# Patient Record
Sex: Male | Born: 1940
Health system: Southern US, Community
[De-identification: ages and names within clinical notes are randomized; demographics above are authoritative.]

## PROBLEM LIST (undated history)

## (undated) DIAGNOSIS — C61 Malignant neoplasm of prostate: Secondary | ICD-10-CM

## (undated) DIAGNOSIS — Z9989 Dependence on other enabling machines and devices: Secondary | ICD-10-CM

## (undated) DIAGNOSIS — J40 Bronchitis, not specified as acute or chronic: Secondary | ICD-10-CM

## (undated) DIAGNOSIS — Z8701 Personal history of pneumonia (recurrent): Secondary | ICD-10-CM

## (undated) DIAGNOSIS — H9190 Unspecified hearing loss, unspecified ear: Secondary | ICD-10-CM

## (undated) DIAGNOSIS — IMO0001 Reserved for inherently not codable concepts without codable children: Secondary | ICD-10-CM

## (undated) DIAGNOSIS — K219 Gastro-esophageal reflux disease without esophagitis: Secondary | ICD-10-CM

## (undated) DIAGNOSIS — I10 Essential (primary) hypertension: Secondary | ICD-10-CM

## (undated) DIAGNOSIS — Z8489 Family history of other specified conditions: Secondary | ICD-10-CM

## (undated) DIAGNOSIS — F410 Panic disorder [episodic paroxysmal anxiety] without agoraphobia: Secondary | ICD-10-CM

## (undated) DIAGNOSIS — R350 Frequency of micturition: Secondary | ICD-10-CM

## (undated) DIAGNOSIS — R51 Headache: Secondary | ICD-10-CM

## (undated) DIAGNOSIS — J449 Chronic obstructive pulmonary disease, unspecified: Secondary | ICD-10-CM

## (undated) DIAGNOSIS — Z973 Presence of spectacles and contact lenses: Secondary | ICD-10-CM

## (undated) DIAGNOSIS — R0600 Dyspnea, unspecified: Secondary | ICD-10-CM

## (undated) DIAGNOSIS — M1990 Unspecified osteoarthritis, unspecified site: Secondary | ICD-10-CM

## (undated) HISTORY — PX: PROSTATE SURGERY: SHX751

## (undated) HISTORY — PX: BACK SURGERY: SHX140

## (undated) HISTORY — DX: Hemochromatosis, unspecified: E83.119

## (undated) HISTORY — DX: Essential (primary) hypertension: I10

## (undated) HISTORY — DX: Gastro-esophageal reflux disease without esophagitis: K21.9

---

## 1970-06-09 HISTORY — PX: KNEE SURGERY: SHX244

## 1990-06-09 HISTORY — PX: CARDIAC CATHETERIZATION: SHX172

## 1997-09-22 ENCOUNTER — Inpatient Hospital Stay (HOSPITAL_COMMUNITY): Admission: EM | Admit: 1997-09-22 | Discharge: 1997-09-25 | Payer: Self-pay | Admitting: Internal Medicine

## 2000-12-31 ENCOUNTER — Encounter: Payer: Self-pay | Admitting: Neurosurgery

## 2000-12-31 ENCOUNTER — Ambulatory Visit (HOSPITAL_COMMUNITY): Admission: RE | Admit: 2000-12-31 | Discharge: 2000-12-31 | Payer: Self-pay | Admitting: Neurosurgery

## 2001-02-03 ENCOUNTER — Ambulatory Visit (HOSPITAL_COMMUNITY): Admission: RE | Admit: 2001-02-03 | Discharge: 2001-02-04 | Payer: Self-pay | Admitting: Neurosurgery

## 2001-02-03 ENCOUNTER — Encounter: Payer: Self-pay | Admitting: Neurosurgery

## 2001-07-08 ENCOUNTER — Ambulatory Visit (HOSPITAL_COMMUNITY): Admission: RE | Admit: 2001-07-08 | Discharge: 2001-07-08 | Payer: Self-pay | Admitting: Neurosurgery

## 2001-07-08 ENCOUNTER — Encounter: Payer: Self-pay | Admitting: Neurosurgery

## 2001-07-09 ENCOUNTER — Encounter: Payer: Self-pay | Admitting: Neurosurgery

## 2001-07-09 ENCOUNTER — Ambulatory Visit (HOSPITAL_COMMUNITY): Admission: RE | Admit: 2001-07-09 | Discharge: 2001-07-10 | Payer: Self-pay | Admitting: Neurosurgery

## 2003-07-19 ENCOUNTER — Inpatient Hospital Stay (HOSPITAL_COMMUNITY): Admission: RE | Admit: 2003-07-19 | Discharge: 2003-07-24 | Payer: Self-pay | Admitting: Urology

## 2003-07-19 ENCOUNTER — Encounter (INDEPENDENT_AMBULATORY_CARE_PROVIDER_SITE_OTHER): Payer: Self-pay | Admitting: *Deleted

## 2004-03-05 ENCOUNTER — Ambulatory Visit (HOSPITAL_COMMUNITY): Admission: RE | Admit: 2004-03-05 | Discharge: 2004-03-05 | Payer: Self-pay | Admitting: Neurosurgery

## 2009-10-15 ENCOUNTER — Ambulatory Visit: Payer: Self-pay | Admitting: Cardiology

## 2009-10-23 ENCOUNTER — Ambulatory Visit (HOSPITAL_COMMUNITY): Admission: RE | Admit: 2009-10-23 | Discharge: 2009-10-24 | Payer: Self-pay | Admitting: Neurosurgery

## 2010-06-09 HISTORY — PX: COLONOSCOPY: SHX174

## 2010-08-27 LAB — DIFFERENTIAL
Basophils Absolute: 0 10*3/uL (ref 0.0–0.1)
Basophils Relative: 0 % (ref 0–1)
Eosinophils Absolute: 0.1 10*3/uL (ref 0.0–0.7)
Eosinophils Relative: 1 % (ref 0–5)
Lymphocytes Relative: 19 % (ref 12–46)
Lymphs Abs: 2.1 10*3/uL (ref 0.7–4.0)
Monocytes Absolute: 1.1 10*3/uL — ABNORMAL HIGH (ref 0.1–1.0)
Monocytes Relative: 10 % (ref 3–12)
Neutro Abs: 8 10*3/uL — ABNORMAL HIGH (ref 1.7–7.7)

## 2010-08-27 LAB — TYPE AND SCREEN: Antibody Screen: NEGATIVE

## 2010-08-27 LAB — CBC
Platelets: 299 10*3/uL (ref 150–400)
RBC: 4.67 MIL/uL (ref 4.22–5.81)
WBC: 11.3 10*3/uL — ABNORMAL HIGH (ref 4.0–10.5)

## 2010-08-27 LAB — SURGICAL PCR SCREEN
MRSA, PCR: NEGATIVE
Staphylococcus aureus: NEGATIVE

## 2010-09-18 DIAGNOSIS — R55 Syncope and collapse: Secondary | ICD-10-CM

## 2010-10-07 ENCOUNTER — Institutional Professional Consult (permissible substitution): Payer: Medicare Other | Admitting: Internal Medicine

## 2010-10-25 NOTE — Op Note (Signed)
NAME:  Ryan Richards, Ryan Richards NO.:  [id number]   MEDICAL RECORD NO.:  [id number]                   PATIENT TYPE:  INP   LOCATION:  X007                                 FACILITY:  [facility]   PHYSICIAN:  Rozanna Boer., M.D.      DATE OF BIRTH:  29-Nov-1940   DATE OF PROCEDURE:  07/19/2003  DATE OF DISCHARGE:                                 OPERATIVE REPORT   PREOPERATIVE DIAGNOSIS:  Adenocarcinoma of the prostate.   POSTOPERATIVE DIAGNOSIS:  Adenocarcinoma of the prostate.   PROCEDURE:  Radical retropubic prostatectomy with bilateral pelvic lymph  node dissection.   SURGEON:  Courtney Paris, M.D.   ASSISTANT:  Susanne Borders, MD   ANESTHESIA:  General endotracheal.   SPECIMENS:  1. Bilateral pelvic lymph nodes.  2. Prostate gland.   DRAINS:  1. 20-French Foley catheter to straight drain.  2. #10 Blake drain to bulb suction.   ESTIMATED BLOOD LOSS:  1500 cc.   DISPOSITION:  To recovery room in stable condition.   BRIEF INDICATIONS FOR SURGERY:  [name] is a 70 year old white male  [facility].  His PSA had risen to 4.3 from 3.0 one year prior. Subsequently underwent a  transrectal ultrasound-guided biopsy at the [facility] hospital and was found to  have Gleason's 3+3=6 adenocarcinoma of the prostate. He was subsequently  referred to Dr. Aldean Ast who discussed with him his various treatment  options including watchful waiting, hormonal therapy, radiation therapy, and  surgery. After careful discussion with full understanding of the risks,  benefits, and alternatives, the patient elected to undergo radical  retropubic prostatectomy.   DESCRIPTION OF PROCEDURE IN DETAIL:  The patient is brought to the operating  room and quickly identified by his identification bracelet. He was given  preoperative antibiotics and general endotracheal anesthesia. He was placed  in the supine  position with a bumper under his pelvis, with the table  slightly flexed, and in reverse Trendelenburg. He was shaved, prepped, and  draped in typical sterile fashion. A midline infraumbilical incision was  made from just below the umbilicus to the superior edge of the pubis. A  scalpel was used to incise the skin. Bovie electrocautery was used to incise  the Camper's and Scarpa fascia, down to the rectus sheath. The rectus sheath  was incised at the midline at the __________ fibers. This revealed diastasis  between the two bellies of the rectus abdominis.  The transversalis fascia  between the two muscle bellies were excised with Bovie electrocautery and  then blunt dissection was used with surgeon's hand to further define the  space of Retzius. A Foley catheter had been placed in the bladder which was  drained. The Buckwalter retractor was set and bilateral pelvic lymph node  dissection was performed. The extent of the lymph node was the external  iliac vein anteriorly. The pocket extended posteriorly to the obturator  nerve. Inferiorly, the circumflex iliac vein was the extent of dissection.  At this level, a large clip was placed on the right side of pocket and the  pocket was incised in this location. Further dissection was used to free the  posterior portion of the pocket up towards the pelvic inlet. A large right-  angle clip was placed to the proximal aspect of the pocket and was incised  with Metzenbaum scissors. A similar dissection was performed on the left  side. The pocket was somewhat thicker distally and the pocket was tied with  a 0-silk tie and incised. Blunt dissection was used to free the left side of  the pocket up superiorly where a large right angle Hemlock clip was placed  and the pocket was incised.  These were sent separately for pathologic  analysis. The bladder was then retracted superiorly. The patient had a thick  superficial branch of the dorsal venous complex  which was gathered with  right angle and doubly ligated with 2-0 silk and incised with Metzenbaum  scissors. A Kitner was used to sweep the areolar tissue away from the  endopelvic fascia bilaterally.  Metzenbaum scissors were used to make an  incision in the endopelvic fascia. Right angle dissection was used to free  this up and endopelvic fascia was released with Metzenbaum scissors  bilaterally from the puboprostatic ligaments distally to the superior margin  of the prostate superiorly. A Kitner was also used to clean off the  puboprostatic ligaments bilaterally. These were easily identified and were  gently taken down with Metzenbaum scissors dropping the apex of the prostate  away from the inferior aspect of the pubis bone. The prostate had dropped  away at this point such that the surgeon's index finger and thumb could be  placed around the dorsal venous complex and palpated inferior to the apex of  the prostate.  A __________  right angle was passed in this plane posterior  to the dorsal venous complex, anterior to the urethra. Two #1 Vicryl ties  were placed over the dorsal venous complex in this dislocation. A third 2-0  Vicryl suture with needle was passed and the back end was tied.  A figure-of-  eight suture ligature was then placed in the dorsal venous complex distally  to the previous ties. A Stamey retractor was used to retract the prostate  superiorly and a back-bleeding stitch using 2-0 Vicryl was placed in figure-  of-eight fashion.  A __________  right angle was replaced in the groove  beneath the dorsal venous complex and the dorsal venous complex was incised  with the Bovie electrocautery. There was minimal bleeding from the dorsal  venous complex at this point and what little bleeding there was was quickly  controlled passing a single finger-of-eight suture through the complex using 2-0 Vicryl. The Stamey retractor was replaced and the urethra was readily  identified.  The neurovascular bundles were identified running parallel to  the urethra bilaterally.  Metzenbaum scissors were used to free these from  the dorsal venous complex bilaterally.  The __________  right angle was then  passed beneath the urethra and a piece of umbilical tape was placed  posterior to the urethra. The anterior wall of the urethra was incised with  long-handle 15-blade knife. The valves of the Foley catheter were well  lubricated and cut with Mayo scissors, and was pulled into the pelvis and re-  clamped with a Tresa Endo.  The posterior urethra was incised with Metzenbaum  scissors. The patient had significant rectourethralis attachments which were  carefully dissected with right-angle dissection and incised with Metzenbaum  scissors. After the rectourethralis attachments were carefully taken down  the posterior plane between the anterior rectal fat and Denonvillier's  fascia could be established using blunt dissection with surgeon's finger.  The lateral pedicles were then systemically taken down with right-angle  dissection.  The bundles, as they were dissected, were tied with 2-0 silk  ties and incised with Metzenbaum scissors bilaterally.  Once the lateral  pedicles were taken down sufficiently, the outlying seminal vesicles beneath  Denonvillier's fascia could be appreciated. Denonvillier's fascia was  incised with the Bovie electrocautery and Metzenbaum scissors were used to  establish this plane.  The bilateral ampulla of the vas deferens were  dissected out with right angle. Large hemlock clips were placed on each vas  deferens and they were incised with Metzenbaum scissors. Seminal vesicles  were then dissected posteriorly.  They were controlled with 0 silk ties and  also incised with Metzenbaum scissors. Attention was then turned to the  bladder neck. An Allis clamp was placed above and below the bladder neck  and the plane was established with Bovie electrocautery. A  Vanderbilt was  used to carefully dissect out the urethra as it entered the bladder neck.  A  right angle was passed beneath the urethra as it entered the bladder neck  and the urethra was incised anteriorly. The Tresa Endo was removed from the cut  end of the Foley catheter and the balloon was drained. The balloon end was  pulled through the bladder neck, folded over on itself, and re-clamped with  a Tresa Endo. The remainder of the bladder neck dissection was performed with  Metzenbaum scissors to establish the plane between the base of the prostate  and the bladder. Bovie electrocautery was used to further dissect the base  of the prostate away from the bladder neck. There was some remaining lateral  pedicle attachments which were incised after placing hemlock clips on the  base of the pedicles. This freed the prostate and it was passed off the  table at this point.  Careful inspection of the pelvis revealed some venous bleeding near the bladder neck. This was controlled with Bovie  electrocautery for the most part. Some bleeding was noted in the lateral  pedicles bilaterally and this was controlled by placing 2-0 Vicryl in a  finger-of-eight fashion. The bladder mucosa was then carefully everted with  several interrupted 3-0 sutures. There was some bleeding noted at 6:00 in  the bladder neck and this was controlled with a 3-0 chromic placed in a  finger-of-eight fashion. There was minimal bleeding from the bladder mucosa  at this point.   Anastomotic sutures were then placed.  The 24-French green wall urethral  sound was placed in the urethra. Anastomotic sutures were placed at 2:00,  5:00, 7:00, 10:00, and 12:00.  The two posterior most sutures were placed in  their corresponding position at the bladder neck. The Foley catheter was  inserted to the urethra into the bladder. The balloon was inflated with  approximately 15 cc of sterile water. The remaining there anastomotic  sutures were placed  at their corresponding locations. The bladder was  released from traction and allowed to drop down into the pelvis. The  anastomotic sutures were carefully tied down.  Asepto syringe was used to  irrigated the Foley catheter and the  anastomosis appeared to be watertight.  There was very minimal  bleeding within the pelvis. The pelvis was copiously  irrigated. A #10 Blake drain was placed in the left lower quadrant making a  stab incision in the anterior abdominal wall and placing a Vanderbilt clamp  through the abdominal wall, grabbing the drain and pulling it through. It  was sewn in place with 2-0 nylon. The rectus sheath was then re-approximated  with running #1 PDS.  Subcutaneous tissues were copiously irrigated and the  skin was closed with a stapling device. All sponge and needle counts were  correct at the end of the case. The patient was awakened from his anesthesia  and taken to the postanesthesia care unit in stable condition. He is under  Dr. Aldean Ast, who was present, and participated in all aspects of this case  as he is the primary surgeon.     Susanne Borders, MD                           Rozanna Boer., M.D.    DR/MEDQ  D:  07/19/2003  T:  07/19/2003  Job:  [id number]

## 2010-10-25 NOTE — H&P (Signed)
NAME:  Ryan Richards, Ryan Richards NO.:  [id number]   MEDICAL RECORD NO.:  [id number]                   PATIENT TYPE:  INP   LOCATION:  X007                                 FACILITY:  [facility]   PHYSICIAN:  Rozanna Boer., M.D.      DATE OF BIRTH:  1940/08/06   DATE OF ADMISSION:  07/19/2003  DATE OF DISCHARGE:                                HISTORY & PHYSICAL   BRIEF HISTORY:  This 70 year old white male was admitted with a T1B Gleason  3+3 adenocarcinoma of the prostate with radical surgery.  A biopsy was done  at [facility] in December, 2004, because his PSA had elevated to 4.3  in August.  It had been 3.0 the year before.  He has not had UTI or  prostatitis.  He had cancer on both sides with 3/11 cores positive for  cancer.  Total length was 4 mm.  The total percentage of cancer in the  specimen from the biopsy was calculated at 2%.  After discussing the  options, he chose surgery over radiation.  He auto donated 2 units of blood  and did a mechanical bowel prep yesterday.  He understands that the risks  include but are not limited to bleeding, impotence, incontinence, deep  venous thrombosis, pulmonary emboli, bleeding, and death.   MEDICATIONS:  1. Nabumetone 75 mg daily.  2. Omeprazole 20 mg q.d.  3. Folic acid 800 mg q.d.  4. Vitamin E 200 IU daily.   ALLERGIES:  1. CODEINE.  2. MORPHINE.   PAST SURGICAL HISTORY:  1. Back spur and surgery on his back in 1987, 2000, and 2003.  2. Left knee operation in 1970.   REVIEW OF SYSTEMS:  He has been disabled from his back since February, 2003.  He has been a nonsmoker for the past nine years with occasional alcohol.  He  had a cardiac cath years ago that was negative and no GI complaints.  No  arthritis other than his back.   FAMILY HISTORY:  He has no children but is married.  He has seven sisters  with one brother.  Two brothers deceased.  One with an MI and one with  pneumonia.  Both  parents deceased in their late 54s.  He does have a  positive family history of cancer with his dad and his uncle having prostate  cancer.   PHYSICAL EXAMINATION:  VITAL SIGNS:  His weight is 210.  Pulse 61,  temperature 98.8, respirations 18, blood pressure 133/89.  GENERAL:  He is a healthy white male in no acute distress.  HEENT:  Clear.  NECK:  Supple.  LUNGS:  Clear to auscultation and percussion.  HEART:  No murmurs, rubs or gallops.  ABDOMEN:  Scars on his back from previous surgery, otherwise negative.  Spleen and liver not enlarged.  GENITOURINARY:  Penis is normal, circumcised, adequate meatus, bilaterally  descended testes.  Epididymis nontender.  Prostate 45-50 gm.  Not  fixed or  indurated.  EXTREMITIES:  No edema.  Good distal pulses.  Intact sensation to light  touch.   IMPRESSION:  1. T1B Gleason 3+3 adenocarcinoma of the prostate.  2. Chronic low back pain.  3. Family history of carcinoma of the prostate.  4. Mild obstructive uropathy.   Recommend radical retropubic prostatectomy, bilateral pelvis lymph node  dissection, as discussed.                                               Rozanna Boer., M.D.    HMK/MEDQ  D:  07/19/2003  T:  07/19/2003  Job:  [id number]

## 2010-10-25 NOTE — Op Note (Signed)
[place]. [facility]  Patient:    Ryan Richards, Ryan Richards Visit Number: [id number] MRN: [id number]          Service Type: DSU Location: 3000 3030 01 Attending Physician:  Ryan Richards Proc. Date: 02/03/01 Adm. Date:  02/03/2001                             Operative Report  PREOPERATIVE DIAGNOSIS:  Bilateral L4-5 herniated nucleus pulposus with stenosis and resultant bilateral L5 radiculopathy.  POSTOPERATIVE DIAGNOSIS:  Bilateral L4-5 herniated nucleus pulposus with stenosis and resultant bilateral L5 radiculopathy.   OPERATION PERFORMED:   Bilateral L4-5 decompressive laminotomies with bilateral L4-5 microdiskectomies.  SURGEON:  Ryan Richards, M.D.  ASSISTANT:  Donalee Richards, Ryan Richards.  ANESTHESIA:  General oral endotracheal.  INDICATIONS FOR PROCEDURE:  Ryan Richards is a 70 year old male with a history of chronic back pain with new onset of bilateral lower extremity pain, paresthesias and weaknesses with a combination of neurogenic claudication and chronic L5 radiculopathies on left greater than right.  The patient has failed conservative management.  MRI scanning demonstrates a bilobe disk herniation at L4-5 with resultant marked stenosis and bilateral compression of the L5 nerve roots.  The patient has been counseled as to his options.  He has decided to proceed with bilateral L4-5 decompressive laminotomies and microdiskectomies for hopeful improvement of symptoms.  DESCRIPTION OF PROCEDURE:  The patient was taken to the operating room and placed on the operating table in supine position.  After an adequate level of anesthesia was achieved, the patient was positioned prone onto a Wilson frame and appropriately padded.  The patients lumbar region was shaved and prepped sterilely.  A 10 blade was used to make a linear skin incision overlying the interspace.  This was carried down sharply in the midline.  Subperiosteal dissection was performed  bilaterally exposing the lamina and facet joints at L4 and L5.  The deep self-retaining retractor was placed.  Intraoperative x-ray was taken and the level was confirmed.  A decompressive laminotomy was then performed bilaterally using a high speed drill and Kerrison rongeurs to remove the inferior one half of the lamina of L4, the medial one third of the L4-5 facet joint and superior rim of the L5 lamina.  The ligamentum flavum was then elevated and resected in a piecemeal fashion using Kerrison rongeur.  The underlying thecal sac and exiting L5 nerve roots were identified bilaterally. Microscope was brought into the field and used for microdissection.  Starting first on the patients right side, the thecal sac and L5 nerve root were mobilized and retracted towards the midline.  The epidural venous plexus was coagulated and cut.  Disk herniation was readily apparent at the level of the disk space.  It was incised with a 15 blade in rectangular fashion.  A wide disk space clean-out was then achieved using pituitary rongeurs, upward angled pituitary rongeurs and Epstein curets.  All loose or obviously degenerative disk material was removed from the interspace.  The procedure was then repeated on the contralateral side.  Once again with the nerve roots protected and retracted, a large disk herniation was encountered just beneath the left-sided L5 nerve root.  This was incised with a 15 blade in a rectangular fashion.  A wide disk space clean-out was then achieved using pituitary rongeurs, upward angled, pituitary rongeurs and Epstein curets.  All loose or obviously degenerated disk material was removed from the interspace from  this approach.  At this point a very thorough diskectomy had been performed. A blunt probe was passed easily beneath the thecal sac and out each neural foramen.  There is no evidence of any continued compression.  The wound was then copiously irrigated with antibiotic  solution.  Gelfoam was placed topically for hemostasis which was found to be good.  Microscope and retractor system were removed.  Hemostasis in the muscle achieved with electrocautery. The wound was then closed in layers with Vicryl sutures.  Steri-Strips and sterile dressing were applied.  There were no apparent complications.  The patient tolerated the procedure well and she returned to the recovery room postoperatively. Attending Physician:  Ryan Richards DD:  02/03/01 TD:  02/03/01 Job: [id number]

## 2010-10-25 NOTE — Op Note (Signed)
[place]. [facility]  Patient:    Ryan Richards, Ryan Richards Visit Number: [id number] MRN: [id number]          Service Type: DSU Location: 3000 3025 01 Attending Physician:  Donn Pierini Dictated by:   Julio Sicks, M.D. Proc. Date: 07/09/01 Admit Date:  07/09/2001 Discharge Date: 07/10/2001                             Operative Report  PREOPERATIVE DIAGNOSIS:  Left L4-5 recurrent herniated nucleus pulposus with radiculopathy.  POSTOPERATIVE DIAGNOSIS:  Left L4-5 recurrent herniated nucleus pulposus with radiculopathy.  OPERATION:  Re-exploration of left L4-5 laminotomy with redo microdiskectomy.  SURGEON:  Julio Sicks, M.D.  ASSISTANT:  Donalee Citrin, Montez Hageman., M.D.  ANESTHESIA:  General endotracheal.  INDICATIONS:  Ryan Richards is a 70 year old male who has previously undergone an L5-S1 laminectomy and diskectomy remotely in the past.  He has recently undergone bilateral L4-5 laminotomies and microdiskectomies for treatment of an acute bilobe disk herniation.  The patient had been doing reasonably well the last 72 hours when he developed severe back pain with radiation into both lower extremities, somewhat greater on the right.  He had some numbness and paresthesias in his perineal region.  The MRI scanning demonstrated a very large left-sided L3-4 recurrent disk herniation with compression of the thecal sac.  Parenthetically, it should be noted that the patient has transitional anatomy at the bottom of his spine and the radiologist called this level L3-4 in his original dictation.  It has previously been referred to as L4-5 on other studies and at the time of his previous operations.  We discussed the options of management including the possibility of reexploring his laminotomy with redo microdiskectomy.  The patient has decided to proceed with surgery in hopes of alleviating some of his symptoms.  DESCRIPTION OF PROCEDURE:  The patient was taken to the  operating room and placed on the operating table in the supine position.  After an adequate level of anesthesia was achieved, the patient was positioned prone onto the Wilson frame and appropriately padded.  Preparation of the lumbar region, prepped and draped sterilely.  We made a skin incision overlying the L4-5 level.  This was carried down sharply in the midline.  A subperiosteal dissection was performed on the left side exposing the lamina and facet joints of L4-5.  Deep self-retaining retractors were placed.  X-rays were taken, and the level was confirmed.  A laminotomy at L4-5 was then reexplored using dental instruments to disrupt the fibrous scar tissue attachments around the laminotomy sites. The laminotomy was then widened using the high speed drill and 2 mm Kerrison rongeurs.  The underlying thecal sac was identified.  Epidural scar was stripped off the spinal sac and left side L4 nerve root repaired.  The microscope was brought into the field to use for microdissection and the left side L5 nerve root and underlying disk herniation.  Epidural venous plexus was coagulated and cut.  The thecal sac and L5 nerve root were mobilized and tracked towards the midline.  The disk herniation was readily apparent.  This was dissected free using a blunt nerve hook.  Disk fragments were then removed using pituitary rongeurs.  The disk space entered and aggressive diskectomy was performed.  All loose degenerative disk material was removed from the interspace.  All of the epidural scar was lysed.  All elements of the disk herniation were completely resected.  At this point, a very thorough decompression had been performed.  There was no evidence of injury to the thecal sac or nerve root.  The wound was then copiously irrigated with antibiotic solution.  Gelfoam was placed postoperatively for hemostasis which was found to be good.  The microscope and retractors were removed.  Hemostasis in the  muscle to achieve electrocautery.  The wound was then closed in layers with Vicryl sutures. Steri-Strips and a sterile dressing were applied.  The patient tolerated the procedure well and returned to the recovery room in good condition. Dictated by:   Julio Sicks, M.D. Attending Physician:  Donn Pierini DD:  07/09/01 TD:  07/11/01 Job: [id number]

## 2010-10-25 NOTE — Discharge Summary (Signed)
NAME:  Ryan Richards, Ryan Richards NO.:  [id number]   MEDICAL RECORD NO.:  [id number]                   PATIENT TYPE:  INP   LOCATION:  0380                                 FACILITY:  [facility]   PHYSICIAN:  Rozanna Boer., M.D.      DATE OF BIRTH:  [birth year]   DATE OF ADMISSION:  07/19/2003  DATE OF DISCHARGE:  07/24/2003                                 DISCHARGE SUMMARY   DISCHARGE DIAGNOSES:  1. T1b Gleason 3+3 adenocarcinoma of the prostate.  2. Chronic low back pain.  3. Mild obstructive uropathy.   OPERATIONS:  Radical retropubic prostatectomy, bilateral pelvic lymph node  dissection on July 19, 2003.   HOSPITAL COURSE:  This 70 year old white male is admitted with a T1b Gleason  3+3 adenocarcinoma of the prostate for radical surgery.  Biopsy done the  [facility] in December 2004, showed that the PSA was 4.3, in August  2004 was done.  It had been 3 the year before.  He has never had an UTI or  prostatitis.  He did have some mild voiding symptoms, but did have cancer on  both sides with 3:11 cords positive for cancer.  Total length was 4 mm, and  the total percentage of cancer in the specimen from the biopsy was  calculated at 2%.  After discussing the options, he chose surgery over  radiation.  He auto donated 2 units of blood and did mechanical bowel prep  the day before surgery.   ALLERGIES:  1. CODEINE.  2. MORPHINE.   PAST SURGICAL HISTORY:  1. Back surgery x3, in 1987, 2000, and 2003.  He has been disabled on his     back since February 2003.  2. Left knee operation in 1970.   After preoperative evaluation, including a normal chest x-ray, EKG, and lab  studies, he was taken to the operating room.  His preoperative hemoglobin  was 15.7, hematocrit 44%.  He underwent radical retropubic prostatectomy  with findings of no gross cancer outside the prostate.  He did have some  slow ooze during this occasion and had gotten his 2  units of autologous  blood.  His hematocrit remained fairly low at 28% and fell to 26, and then  on July 23, 2003, the fourth postoperative day it was 22.8.  His  recovery was delayed because of some nausea and vomiting on the third  postoperative day.  He was put back on clear liquids and then re-ambulated.  His hemoglobin stayed stable.  His path showed negative nodes, negative  margins, capsular extension was not identified, but the tumor was confined  by the prostatic capsule, however, it __________ up very focally to the  inked margin of the right lateral lobe near the apex.  The patient was on a  regular diet and was transfused with 2 more units of packed red blood cells  before he left for home, and was discharged on the fifth postoperative day  in satisfactory ambulatory  condition.  He was taking his preoperative medications, including Nabumetone  75 mg a day, Omeprazole 20 mg daily, Folic acid 800 mg daily, vitamin E 200  units per day, also taking Cipro 500 mg a day, Vicodin for pain, which he  tolerated very well.  He will return to the office in a couple of days just  for a suture removal.                                               Rozanna Boer., M.D.    HMK/MEDQ  D:  07/24/2003  T:  07/24/2003  Job:  [id number]

## 2012-07-25 ENCOUNTER — Emergency Department (HOSPITAL_COMMUNITY): Payer: Medicare Other

## 2012-07-25 ENCOUNTER — Other Ambulatory Visit: Payer: Self-pay

## 2012-07-25 ENCOUNTER — Encounter (HOSPITAL_COMMUNITY): Payer: Self-pay | Admitting: *Deleted

## 2012-07-25 ENCOUNTER — Observation Stay (HOSPITAL_COMMUNITY)
Admission: EM | Admit: 2012-07-25 | Discharge: 2012-07-26 | Disposition: A | Discharge status: Home / Self Care | Payer: Medicare Other | Attending: Internal Medicine | Admitting: Internal Medicine

## 2012-07-25 DIAGNOSIS — K219 Gastro-esophageal reflux disease without esophagitis: Secondary | ICD-10-CM | POA: Insufficient documentation

## 2012-07-25 DIAGNOSIS — I10 Essential (primary) hypertension: Secondary | ICD-10-CM | POA: Insufficient documentation

## 2012-07-25 DIAGNOSIS — R0602 Shortness of breath: Secondary | ICD-10-CM | POA: Insufficient documentation

## 2012-07-25 DIAGNOSIS — R0789 Other chest pain: Principal | ICD-10-CM | POA: Insufficient documentation

## 2012-07-25 DIAGNOSIS — R079 Chest pain, unspecified: Secondary | ICD-10-CM

## 2012-07-25 HISTORY — DX: Gastro-esophageal reflux disease without esophagitis: K21.9

## 2012-07-25 HISTORY — DX: Bronchitis, not specified as acute or chronic: J40

## 2012-07-25 HISTORY — DX: Malignant neoplasm of prostate: C61

## 2012-07-25 LAB — CBC WITH DIFFERENTIAL/PLATELET
Eosinophils Absolute: 0.1 10*3/uL (ref 0.0–0.7)
HCT: 45 % (ref 39.0–52.0)
Lymphocytes Relative: 36 % (ref 12–46)
Lymphs Abs: 2.1 10*3/uL (ref 0.7–4.0)
MCH: 33.5 pg (ref 26.0–34.0)
MCHC: 35.8 g/dL (ref 30.0–36.0)
Neutro Abs: 3 10*3/uL (ref 1.7–7.7)
RBC: 4.8 MIL/uL (ref 4.22–5.81)
RDW: 12.5 % (ref 11.5–15.5)
WBC: 5.8 10*3/uL (ref 4.0–10.5)

## 2012-07-25 LAB — BASIC METABOLIC PANEL
GFR calc Af Amer: 73 mL/min — ABNORMAL LOW (ref >90)
Potassium: 3.8 mEq/L (ref 3.5–5.1)
Sodium: 142 mEq/L (ref 135–145)

## 2012-07-25 LAB — TROPONIN I: Troponin I: <0.3 ng/mL (ref <0.30)

## 2012-07-25 MED ORDER — TRAMADOL HCL 50 MG PO TABS: 50.0000 mg | ORAL_TABLET | Freq: Four times a day (QID) | ORAL | Status: DC | PRN

## 2012-07-25 MED ORDER — NITROGLYCERIN 0.4 MG SL SUBL
0.4000 mg | SUBLINGUAL_TABLET | SUBLINGUAL | Status: DC | PRN
  Administered 2012-07-25: 0.4 mg via SUBLINGUAL
  Filled 2012-07-25: qty 25

## 2012-07-25 MED ORDER — ASPIRIN 325 MG PO TABS
325.0000 mg | ORAL_TABLET | Freq: Once | ORAL | Status: AC
  Administered 2012-07-25: 325 mg via ORAL
  Filled 2012-07-25: qty 1

## 2012-07-25 NOTE — ED Notes (Signed)
Patient states pain is increasing; requesting NTG at this time.  NTG x 1 given.

## 2012-07-25 NOTE — ED Provider Notes (Signed)
History  This chart was scribed for Ryan Hutching, MD by Erskine Emery, ED Scribe. This patient was seen in room APA19/APA19 and the patient's care was started at 21:16.   CSN: [id number]  Arrival date & time 07/25/12  2103   None     Chief Complaint  Patient presents with  . Chest Pain    (Consider location/radiation/quality/duration/timing/severity/associated sxs/prior treatment) The history is provided by the spouse and the patient. No language interpreter was used.  Ryan Richards is a 72 y.o. male who presents to the Emergency Department complaining of constant but gradually improving centralized pressure-like chest pain that does not radiate for the past 2 hours. Pt reports some associated SOB and feeling hot but he denies any diaphoresis, emesis, or nausea. Pt claims he was shoveling snow on a full stomach just before the onset of the pain. Pt took some nitroglycerine this evening, which he has only taken once before, and it helped relieve the pain. Pt had a catheterization in 1992 that was normal. He has no h/o heart failure but reports he has been "borderline" hypertensive for years. Pt's spouse reports noticing his elevated blood pressure the last couple days, especially this morning. Pt was taking aspirin but he stopped. He is still taking his medication for GERD. He has no h/o DM or smoking and he has no family h/o heart failure. Pt's spouse reports he has been eating mainly fruits and vegetables lately to lose weight.  History reviewed. No pertinent past medical history.  History reviewed. No pertinent past surgical history.  No family history on file.  History  Substance Use Topics  . Smoking status: Not on file  . Smokeless tobacco: Not on file  . Alcohol Use: Not on file      Review of Systems A complete 10 system review of systems was obtained and all systems are negative except as noted in the HPI and PMH.    Allergies  Review of patient's allergies indicates  not on file.  Home Medications  No current outpatient prescriptions on file.  There were no vitals taken for this visit.  Physical Exam  Nursing note and vitals reviewed. Constitutional: He is oriented to person, place, and time. He appears well-developed and well-nourished.  HENT:  Head: Normocephalic and atraumatic.  Eyes: Conjunctivae and EOM are normal. Pupils are equal, round, and reactive to light.  Neck: Normal range of motion. Neck supple.  Cardiovascular: Normal rate, regular rhythm and normal heart sounds.   Pulmonary/Chest: Effort normal and breath sounds normal.  Abdominal: Soft. Bowel sounds are normal.  Musculoskeletal: Normal range of motion.  Neurological: He is alert and oriented to person, place, and time.  Skin: Skin is warm and dry.  Psychiatric: He has a normal mood and affect.    ED Course  Procedures (including critical care time) DIAGNOSTIC STUDIES: Oxygen Saturation is 96% on room air, adequate by my interpretation.    COORDINATION OF CARE: 21:16--I evaluated the patient and we discussed a treatment plan including chest x-ray, EKG, and blood work to which the pt and his wife agreed.    Results for orders placed during the hospital encounter of 07/25/12  CBC WITH DIFFERENTIAL      Result Value Range   WBC 5.8  4.0 - 10.5 K/uL   RBC 4.80  4.22 - 5.81 MIL/uL   Hemoglobin 16.1  13.0 - 17.0 g/dL   HCT 44.0  10.2 - 72.5 %   MCV 93.8  78.0 - 100.0  fL   MCH 33.5  26.0 - 34.0 pg   MCHC 35.8  30.0 - 36.0 g/dL   RDW 82.9  56.2 - 13.0 %   Platelets 210  150 - 400 K/uL   Neutrophils Relative 52  43 - 77 %   Neutro Abs 3.0  1.7 - 7.7 K/uL   Lymphocytes Relative 36  12 - 46 %   Lymphs Abs 2.1  0.7 - 4.0 K/uL   Monocytes Relative 10  3 - 12 %   Monocytes Absolute 0.6  0.1 - 1.0 K/uL   Eosinophils Relative 2  0 - 5 %   Eosinophils Absolute 0.1  0.0 - 0.7 K/uL   Basophils Relative 0  0 - 1 %   Basophils Absolute 0.0  0.0 - 0.1 K/uL  BASIC METABOLIC PANEL       Result Value Range   Sodium 142  135 - 145 mEq/L   Potassium 3.8  3.5 - 5.1 mEq/L   Chloride 105  96 - 112 mEq/L   CO2 28  19 - 32 mEq/L   Glucose, Bld 100 (*) 70 - 99 mg/dL   BUN 15  6 - 23 mg/dL   Creatinine, Ser 8.65  0.50 - 1.35 mg/dL   Calcium 9.3  8.4 - 78.4 mg/dL   GFR calc non Af Amer 63 (*) >90 mL/min   GFR calc Af Amer 73 (*) >90 mL/min  TROPONIN I      Result Value Range   Troponin I <0.30  <0.30 ng/mL   Dg Chest Port 1 View  07/25/2012  *RADIOLOGY REPORT*  Clinical Data: Chest pain  PORTABLE CHEST - 1 VIEW  Comparison: Prior chest x-ray 05/26/2012  Findings: Stable appearance of the chest with chronic elevation of the left hemidiaphragm and mild cardiomegaly.  Mild pulmonary vascular congestion without overt edema.  Trace bibasilar atelectasis persists.  Negative for effusion, pneumothorax or new airspace consolidation.  No acute osseous abnormality.  IMPRESSION:  1.  No acute cardiopulmonary disease. 2.  Stable mild cardiomegaly and central vascular congestion without overt edema.   Original Report Authenticated By: Malachy Moan, M.D.       No diagnosis found.   Date: 07/25/2012  Rate: 78  Rhythm: normal sinus rhythm  QRS Axis: normal  Intervals: normal  ST/T Wave abnormalities: normal  Conduction Disutrbances: none  Narrative Interpretation: unremarkable     MDM  Pressure sensation chest after shoveling snow today.  Risk factors include hypercholesterolemia and hypertension along with age and male sex.  Good response to sublingual nitroglycerin. Admit.      I personally performed the services described in this documentation, which was scribed in my presence. The recorded information has been reviewed and is accurate.    Ryan Hutching, MD 07/25/12 [phone]

## 2012-07-25 NOTE — ED Notes (Signed)
Patient states has been shoveling snow today and c/o chest pain.  Wife states that patient sometimes vagals and feels faint.  A&O; skin w/d. Respirations even and unlabored; able to speak in complete sentences without difficulty.

## 2012-07-25 NOTE — ED Notes (Addendum)
Pt c/o constant centralized chest pressure x 2hrs. Pt does report some sob. Pt had episode of chest pain yesterday as well. Pt has been shoveling snow today.

## 2012-07-25 NOTE — ED Notes (Signed)
Patient refusing NTG at this time; states it gives him a bad headache.

## 2012-07-26 ENCOUNTER — Encounter (HOSPITAL_COMMUNITY): Payer: Self-pay

## 2012-07-26 DIAGNOSIS — I10 Essential (primary) hypertension: Secondary | ICD-10-CM

## 2012-07-26 DIAGNOSIS — K219 Gastro-esophageal reflux disease without esophagitis: Secondary | ICD-10-CM | POA: Diagnosis present

## 2012-07-26 LAB — TROPONIN I
Troponin I: <0.3 ng/mL (ref <0.30)
Troponin I: <0.3 ng/mL (ref <0.30)

## 2012-07-26 MED ORDER — PANTOPRAZOLE SODIUM 40 MG PO TBEC
40.0000 mg | DELAYED_RELEASE_TABLET | Freq: Every day | ORAL | Status: DC
  Administered 2012-07-26: 40 mg via ORAL
  Filled 2012-07-26: qty 1

## 2012-07-26 MED ORDER — METOPROLOL TARTRATE 25 MG PO TABS
12.5000 mg | ORAL_TABLET | Freq: Two times a day (BID) | ORAL | Status: DC
  Administered 2012-07-26 (×2): 12.5 mg via ORAL
  Filled 2012-07-26 (×2): qty 1

## 2012-07-26 MED ORDER — SENNOSIDES-DOCUSATE SODIUM 8.6-50 MG PO TABS: 1.0000 | ORAL_TABLET | Freq: Every evening | ORAL | Status: DC | PRN

## 2012-07-26 MED ORDER — NITROGLYCERIN 0.4 MG SL SUBL: 0.4000 mg | SUBLINGUAL_TABLET | SUBLINGUAL | Status: DC | PRN

## 2012-07-26 MED ORDER — ONDANSETRON HCL 4 MG PO TABS: 4.0000 mg | ORAL_TABLET | Freq: Four times a day (QID) | ORAL | Status: DC | PRN

## 2012-07-26 MED ORDER — ZOLPIDEM TARTRATE 5 MG PO TABS: 5.0000 mg | ORAL_TABLET | Freq: Every evening | ORAL | Status: DC | PRN

## 2012-07-26 MED ORDER — ACETAMINOPHEN 325 MG PO TABS
650.0000 mg | ORAL_TABLET | Freq: Four times a day (QID) | ORAL | Status: DC | PRN
  Administered 2012-07-26: 650 mg via ORAL
  Filled 2012-07-26: qty 2

## 2012-07-26 MED ORDER — ALUM & MAG HYDROXIDE-SIMETH 200-200-20 MG/5ML PO SUSP: 30.0000 mL | Freq: Four times a day (QID) | ORAL | Status: DC | PRN

## 2012-07-26 MED ORDER — SODIUM CHLORIDE 0.9 % IJ SOLN
3.0000 mL | Freq: Two times a day (BID) | INTRAMUSCULAR | Status: DC
  Filled 2012-07-26: qty 3

## 2012-07-26 MED ORDER — SODIUM CHLORIDE 0.9 % IJ SOLN
3.0000 mL | Freq: Two times a day (BID) | INTRAMUSCULAR | Status: DC
  Administered 2012-07-26 (×2): 3 mL via INTRAVENOUS

## 2012-07-26 MED ORDER — TRAMADOL HCL 50 MG PO TABS: 50.0000 mg | ORAL_TABLET | Freq: Four times a day (QID) | ORAL | Status: DC | PRN

## 2012-07-26 MED ORDER — SODIUM CHLORIDE 0.9 % IJ SOLN: 3.0000 mL | INTRAMUSCULAR | Status: DC | PRN

## 2012-07-26 MED ORDER — ACETAMINOPHEN 650 MG RE SUPP: 650.0000 mg | Freq: Four times a day (QID) | RECTAL | Status: DC | PRN

## 2012-07-26 MED ORDER — SODIUM CHLORIDE 0.9 % IV SOLN: 250.0000 mL | INTRAVENOUS | Status: DC | PRN

## 2012-07-26 MED ORDER — ONDANSETRON HCL 4 MG/2ML IJ SOLN: 4.0000 mg | Freq: Four times a day (QID) | INTRAMUSCULAR | Status: DC | PRN

## 2012-07-26 NOTE — Discharge Summary (Signed)
Physician Discharge Summary  Ryan Richards:[id number] DOB: [birth year] DOA: 07/25/2012  PCP: No primary provider on file.  Admit date: 07/25/2012 Discharge date: 07/26/2012  Recommendations for Outpatient Follow-up:  1. Pt will need to follow up with PCP in 2-3 weeks post discharge 2. Please obtain BMP to evaluate electrolytes and kidney function 3. Please also check CBC to evaluate Hg and Hct levels 4. I have scheduled an appointment with cardiologist for further evaluation and recommendations   Discharge Diagnoses: Chest pain Principal Problem:   Chest pain Active Problems:   HTN (hypertension)   GERD (gastroesophageal reflux disease)  Discharge Condition: Stable  Diet recommendation: Heart healthy diet discussed in details   History of present illness:  Pt is 72 y.o. male with HTN and GERD who presented to the [facility] complaining of chest pain. The chest pain started one day prior to admission after shoveling snow, acute onset chest tightness, with pressure and pain, central chest, no radiation, mild SOB, no diaphoresis, no NV. He does say that he started working after eating a large meal. He reports a history of esophageal spasm in 1992 where he actually had a cardiac cath done for persistent 10/10 chest pain-at that time his coronaries were completely normal. He gets his medical care at the [place]. His lipids have been slightly elevated, he is statin intolerant. He takes ASA, reports his BP has been more elevated than usual, he monitors this at home. Pain resolved in ED after 1 sublingual dose of NTG, his CE's are negative and his EKG is normal.  Hospital Course: Chest pain Principal Problem:   Chest pain - unclear etiology and possibly musculoskeletal - stress test may be of benefit but not necessary inpatient - CE x 2 sets negative, TSH within normal limits - will schedule an appointment in an outpatient setting with cardiologist  Active Problems:   HTN (hypertension) -  reasonable control inpatient   GERD (gastroesophageal reflux disease) - stable while in hospital   Procedures/Studies: Dg Chest Port 1 View 07/25/2012   1.  No acute cardiopulmonary disease.  2.  Stable mild cardiomegaly and central vascular congestion without overt edema.    Consultations:  None  Antibiotics:  None  Discharge Exam: Filed Vitals:   07/26/12 1440  BP: 145/85  Pulse: 52  Temp: 97.7 F (36.5 C)  Resp:    Filed Vitals:   07/25/12 2332 07/26/12 0021 07/26/12 0639 07/26/12 1440  BP: 128/76 150/89 138/77 145/85  Pulse: 62 67 52 52  Temp: 98.2 F (36.8 C) 97.6 F (36.4 C) 98.1 F (36.7 C) 97.7 F (36.5 C)  TempSrc: Oral Oral Oral   Resp: 16     Height:  6\' 1"  (1.854 m)    Weight:  220 lb (99.791 kg)    SpO2: 97% 97% 97% 97%    General: Pt is alert, follows commands appropriately, not in acute distress Cardiovascular: Regular rate and rhythm, S1/S2 +, no murmurs, no rubs, no gallops Respiratory: Clear to auscultation bilaterally, no wheezing, no crackles, no rhonchi Abdominal: Soft, non tender, non distended, bowel sounds +, no guarding Extremities: no edema, no cyanosis, pulses palpable bilaterally DP and PT Neuro: Grossly nonfocal  Discharge Instructions  Discharge Orders   Future Appointments Provider Department Dept Phone   07/29/2012 1:15 PM Wendall Stade, MD [facility] [place] [phone]   Future Orders Complete By Expires     Diet - low sodium heart healthy  As directed     Increase activity  slowly  As directed         Medication List    TAKE these medications       Fish Oil 1200 MG Caps  Take 1,200 mg by mouth 2 (two) times daily.     multivitamin tablet  Take 1 tablet by mouth daily.     nitroGLYCERIN 0.4 MG SL tablet  Commonly known as:  NITROSTAT  Place 1 tablet (0.4 mg total) under the tongue every 5 (five) minutes as needed for chest pain (CP or SOB).     omeprazole 20 MG capsule  Commonly known as:   PRILOSEC  Take 20 mg by mouth daily.     traMADol 50 MG tablet  Commonly known as:  ULTRAM  Take 1 tablet (50 mg total) by mouth every 6 (six) hours as needed.           Follow-up Information   Follow up with Charlton Haws, MD On 07/29/2012. (appointment scheduled for 1:15 pm, please atrrive 15 minutes earlier)    Contact information:   [address] [place] [address] [phone]        The results of significant diagnostics from this hospitalization (including imaging, microbiology, ancillary and laboratory) are listed below for reference.     Microbiology: No results found for this or any previous visit (from the past 240 hour(s)).   Labs: Basic Metabolic Panel:  Recent Labs Lab 07/25/12 2120  NA 142  K 3.8  CL 105  CO2 28  GLUCOSE 100*  BUN 15  CREATININE 1.14  CALCIUM 9.3   CBC:  Recent Labs Lab 07/25/12 2120  WBC 5.8  NEUTROABS 3.0  HGB 16.1  HCT 45.0  MCV 93.8  PLT 210   Cardiac Enzymes:  Recent Labs Lab 07/25/12 2120 07/26/12 0234 07/26/12 0844  TROPONINI <0.30 <0.30 <0.30   SIGNED: Time coordinating discharge: Over 30 minutes  Debbora Presto, MD  [facility] 07/26/2012, 2:40 PM Pager [phone]  If 7PM-7AM, please contact night-coverage [email or web address] Password [email or web address]

## 2012-07-26 NOTE — Progress Notes (Signed)
UR Chart Review Completed  

## 2012-07-26 NOTE — H&P (Signed)
[facility] History and Physical  TAMI BARREN WUJ:[id number] DOB: May 15, 1941 DOA: 07/25/2012  Referring physician: Particia Lather PCP: No primary provider on file.  Specialists: [facility]  Chief Complaint: Chest Pain  HPI: Ryan Richards is a 72 y.o. male with HTN and GERD who presented to the [facility] complaining of chest pain. The chest pain started yesterday after shoveling snow, acute onset chest tightness, with pressure and pain, central chest no radiation, mild SOB, no diaphoresis, no NV. He does say that he started working after eating a large meal. He reports a history of esophageal spasm in 1992 where he actually had a cardiac cath done for persistent 10/10 chest pain-at that time his coronaries were completely normal. He get his medical care at the [place]. His lipids have been slightly elevated, he is statin intolerant. He takes ASA, reports his BP has been more elevated than usual, he monitors this at home. Pain resolved in ED after 1 sublingual dose of NTG, his CE's are negative and his EKG is normal.   Review of Systems: The patient denies anorexia, fever, weight loss,, vision loss, decreased hearing, hoarseness, syncope, dyspnea on exertion, peripheral edema, balance deficits, hemoptysis, abdominal pain, melena, hematochezia, severe indigestion/heartburn, hematuria, incontinence, genital sores, muscle weakness, suspicious skin lesions, transient blindness, difficulty walking, depression, unusual weight change, abnormal bleeding, enlarged lymph nodes, angioedema, and breast masses.    Past Medical History  Diagnosis Date  . Prostate cancer   . Bronchitis   . Acid reflux    Past Surgical History  Procedure Laterality Date  . Back surgery    . Prostate surgery    . Knee surgery     Social History:  reports that he has never smoked. He does not have any smokeless tobacco history on file. He reports that  drinks alcohol. He reports that he does not use illicit drugs. Lives  at home with his wife, active and without disability.  Allergies  Allergen Reactions  . Codeine   . Morphine And Related     History reviewed. No pertinent family history. No history of CAD or MI.  Prior to Admission medications   Medication Sig Start Date End Date Taking? Authorizing Provider  Multiple Vitamin (MULTIVITAMIN) tablet Take 1 tablet by mouth daily.   Yes Historical Provider, MD  Omega-3 Fatty Acids (FISH OIL) 1200 MG CAPS Take 1,200 mg by mouth 2 (two) times daily.   Yes Historical Provider, MD  omeprazole (PRILOSEC) 20 MG capsule Take 20 mg by mouth daily.   Yes Historical Provider, MD   Physical Exam: Filed Vitals:   07/25/12 2216 07/25/12 2332 07/25/12 2332 07/26/12 0021  BP: 127/75 131/78 128/76 150/89  Pulse: 60 64 62 67  Temp: 98.1 F (36.7 C)  98.2 F (36.8 C) 97.6 F (36.4 C)  TempSrc: Oral  Oral Oral  Resp: 18 18 16    Height:    6\' 1"  (1.854 m)  Weight:    99.791 kg (220 lb)  SpO2: 93% 96% 97% 97%     General:  Healthy appearing  Eyes: normal  ENT: normal  Neck: normal  Cardiovascular: RRR, no mrg  Respiratory: CTAB  Abdomen: soft NT  Skin: normal, no lesions or rashes  Musculoskeletal: normal  Psychiatric: appropriate  Neurologic: non-focal  Labs on Admission:  Basic Metabolic Panel:  Recent Labs Lab 07/25/12 2120  NA 142  K 3.8  CL 105  CO2 28  GLUCOSE 100*  BUN 15  CREATININE 1.14  CALCIUM 9.3  Liver Function Tests: No results found for this basename: AST, ALT, ALKPHOS, BILITOT, PROT, ALBUMIN,  in the last 168 hours No results found for this basename: LIPASE, AMYLASE,  in the last 168 hours No results found for this basename: AMMONIA,  in the last 168 hours CBC:  Recent Labs Lab 07/25/12 2120  WBC 5.8  NEUTROABS 3.0  HGB 16.1  HCT 45.0  MCV 93.8  PLT 210   Cardiac Enzymes:  Recent Labs Lab 07/25/12 2120 07/26/12 0234  TROPONINI <0.30 <0.30   Radiological Exams on Admission: Dg Chest Port 1  View  07/25/2012  *RADIOLOGY REPORT*  Clinical Data: Chest pain  PORTABLE CHEST - 1 VIEW  Comparison: Prior chest x-ray 05/26/2012  Findings: Stable appearance of the chest with chronic elevation of the left hemidiaphragm and mild cardiomegaly.  Mild pulmonary vascular congestion without overt edema.  Trace bibasilar atelectasis persists.  Negative for effusion, pneumothorax or new airspace consolidation.  No acute osseous abnormality.  IMPRESSION:  1.  No acute cardiopulmonary disease. 2.  Stable mild cardiomegaly and central vascular congestion without overt edema.   Original Report Authenticated By: Malachy Moan, M.D.     EKG: Independently reviewed. NSR  Assessment/Plan 72 yo with HTN, hyperlipidemia, and GERD admitted with atypical chest pain, possibly exertional but he had also eaten a large meal, has a history of esophageal spasm and moderately severe GERD requiring daily PPI, Nonetheless, he will be admitted for ACS rule out. So far CE are negative and EKG is normal, his chest pain has completely resolved. The only abnormality is some mild vascular congestion noted on his CXR, but no overt edema.   Admitted as Observation  Tele, Cycle CE, repeat 12 Lead  Fasting Lipids in AM  Given ASA, BB, O2 and PPI  Code Status: Full Code Family Communication: Discussed plan with patient Disposition Plan: Home with outpatient stress test, if CE negative increase PPI to BID, may need GI referral for EGD. Low dose diltiazem may help with esophageal spam if he need additional BP control. Time spent: 50 minutes  [facility] Pager [phone]  If 7PM-7AM, please contact night-coverage [email or web address] Password [facility] 07/26/2012, 4:10 AM

## 2012-07-27 NOTE — Progress Notes (Signed)
Discharge instructions and prescriptions given, verbalized understanding, out in stable condition with staff. 

## 2012-07-29 ENCOUNTER — Encounter: Payer: Medicare Other | Admitting: Cardiovascular Disease

## 2013-06-09 DIAGNOSIS — Z8701 Personal history of pneumonia (recurrent): Secondary | ICD-10-CM

## 2013-06-09 HISTORY — DX: Personal history of pneumonia (recurrent): Z87.01

## 2014-06-21 ENCOUNTER — Other Ambulatory Visit: Payer: Self-pay | Admitting: Neurosurgery

## 2014-06-21 DIAGNOSIS — M5416 Radiculopathy, lumbar region: Secondary | ICD-10-CM

## 2014-07-04 ENCOUNTER — Ambulatory Visit
Admission: RE | Admit: 2014-07-04 | Discharge: 2014-07-04 | Disposition: A | Discharge status: Home / Self Care | Payer: Medicare Other | Source: Ambulatory Visit | Attending: Neurosurgery | Admitting: Neurosurgery

## 2014-07-04 DIAGNOSIS — M5416 Radiculopathy, lumbar region: Secondary | ICD-10-CM

## 2014-07-04 MED ORDER — GADOBENATE DIMEGLUMINE 529 MG/ML IV SOLN
20.0000 mL | Freq: Once | INTRAVENOUS | Status: AC | PRN
  Administered 2014-07-04: 20 mL via INTRAVENOUS

## 2014-10-31 ENCOUNTER — Other Ambulatory Visit: Payer: Self-pay | Admitting: Neurosurgery

## 2014-10-31 DIAGNOSIS — M5416 Radiculopathy, lumbar region: Secondary | ICD-10-CM

## 2015-01-05 ENCOUNTER — Other Ambulatory Visit: Payer: Self-pay | Admitting: Orthopaedic Surgery

## 2015-01-05 DIAGNOSIS — M542 Cervicalgia: Secondary | ICD-10-CM

## 2015-01-09 ENCOUNTER — Ambulatory Visit
Admission: RE | Admit: 2015-01-09 | Discharge: 2015-01-09 | Disposition: A | Discharge status: Home / Self Care | Payer: Medicare Other | Source: Ambulatory Visit | Attending: Orthopaedic Surgery | Admitting: Orthopaedic Surgery

## 2015-01-09 DIAGNOSIS — M542 Cervicalgia: Secondary | ICD-10-CM

## 2015-01-26 ENCOUNTER — Other Ambulatory Visit: Payer: Self-pay | Admitting: Neurosurgery

## 2015-03-09 ENCOUNTER — Encounter (HOSPITAL_COMMUNITY): Payer: Self-pay

## 2015-03-09 ENCOUNTER — Encounter (HOSPITAL_COMMUNITY)
Admission: RE | Admit: 2015-03-09 | Discharge: 2015-03-09 | Disposition: A | Discharge status: Home / Self Care | Payer: Medicare Other | Source: Ambulatory Visit | Attending: Neurosurgery | Admitting: Neurosurgery

## 2015-03-09 DIAGNOSIS — Z01818 Encounter for other preprocedural examination: Secondary | ICD-10-CM | POA: Diagnosis present

## 2015-03-09 DIAGNOSIS — M4802 Spinal stenosis, cervical region: Secondary | ICD-10-CM | POA: Insufficient documentation

## 2015-03-09 HISTORY — DX: Essential (primary) hypertension: I10

## 2015-03-09 HISTORY — DX: Personal history of pneumonia (recurrent): Z87.01

## 2015-03-09 HISTORY — DX: Family history of other specified conditions: Z84.89

## 2015-03-09 HISTORY — DX: Headache: R51

## 2015-03-09 HISTORY — DX: Unspecified osteoarthritis, unspecified site: M19.90

## 2015-03-09 HISTORY — DX: Chronic obstructive pulmonary disease, unspecified: J44.9

## 2015-03-09 HISTORY — DX: Reserved for inherently not codable concepts without codable children: IMO0001

## 2015-03-09 HISTORY — DX: Frequency of micturition: R35.0

## 2015-03-09 LAB — BASIC METABOLIC PANEL
Anion gap: 6 (ref 5–15)
BUN: 9 mg/dL (ref 6–20)
CO2: 25 mmol/L (ref 22–32)
CREATININE: 1.03 mg/dL (ref 0.61–1.24)
Calcium: 9.4 mg/dL (ref 8.9–10.3)
Chloride: 107 mmol/L (ref 101–111)
GFR calc Af Amer: >60 mL/min (ref >60)
GFR calc non Af Amer: >60 mL/min (ref >60)
GLUCOSE: 99 mg/dL (ref 65–99)
POTASSIUM: 4 mmol/L (ref 3.5–5.1)
Sodium: 138 mmol/L (ref 135–145)

## 2015-03-09 LAB — CBC
HEMATOCRIT: 45 % (ref 39.0–52.0)
HEMOGLOBIN: 15.2 g/dL (ref 13.0–17.0)
MCH: 32.1 pg (ref 26.0–34.0)
MCHC: 33.8 g/dL (ref 30.0–36.0)
MCV: 94.9 fL (ref 78.0–100.0)
Platelets: 189 10*3/uL (ref 150–400)
RBC: 4.74 MIL/uL (ref 4.22–5.81)
RDW: 12.8 % (ref 11.5–15.5)
WBC: 4.7 10*3/uL (ref 4.0–10.5)

## 2015-03-09 LAB — SURGICAL PCR SCREEN
MRSA, PCR: NEGATIVE
Staphylococcus aureus: NEGATIVE

## 2015-03-09 NOTE — Progress Notes (Signed)
   03/09/15 1028  OBSTRUCTIVE SLEEP APNEA  Have you ever been diagnosed with sleep apnea through a sleep study? No  Do you snore loudly (loud enough to be heard through closed doors)?  0  Do you often feel tired, fatigued, or sleepy during the daytime (such as falling asleep during driving or talking to someone)? 1  Has anyone observed you stop breathing during your sleep? 0  Do you have, or are you being treated for high blood pressure? 1  BMI more than 35 kg/m2? 0  Age > 50 (1-yes) 1  Neck circumference greater than:Male 16 inches or larger, Male 17inches or larger? 1  Male Gender (Yes=1) 1  Obstructive Sleep Apnea Score 5

## 2015-03-09 NOTE — Pre-Procedure Instructions (Signed)
    Ryan Richards  03/09/2015     No Pharmacies Listed   Your procedure is scheduled on Tuesday, October 11th, 2016.  Report to [facility] Admitting at 5:30 A.M.  Call this number if you have problems the morning of surgery:  [phone]   Remember:  Do not eat food or drink liquids after midnight.   Take these medicines the morning of surgery with A SIP OF WATER: Albuterol Inhaler if needed (please bring with you), Omeprazole (Prilosec).  Stop taking the following medications on Tuesday, October 4th: Omega-3 Fatty Acids (fish oil), Aspirin, NSAIDS, Ibuprofen, Aleve, Naproxen, BC's, Motrin, all herbal medications, and all vitamins.    Do not wear jewelry.  Do not wear lotions, powders, or colognes.  You may NOT wear deodorant.  Men may shave face and neck.  Do not bring valuables to the hospital.  [facility] is not responsible for any belongings or valuables.  Contacts, dentures or bridgework may not be worn into surgery.  Leave your suitcase in the car.  After surgery it may be brought to your room.  For patients admitted to the hospital, discharge time will be determined by your treatment team.  Patients discharged the day of surgery will not be allowed to drive home.   Special instructions:  See attached.   Please read over the following fact sheets that you were given. Pain Booklet, Coughing and Deep Breathing, MRSA Information and Surgical Site Infection Prevention

## 2015-03-09 NOTE — Progress Notes (Signed)
PCP - Florian Buff, PA Pt. States that he also goes to the [facility] [place] in [place].  Cardiologist - denies  EKG - 03/09/15 - Epic CXR- requested from pulmonologist   Echo- 2014 - Epic Stress test- requested Cardiac cath- 1992  Patient denies shortness of breath and chest pain at PAT appointment.

## 2015-03-12 NOTE — Progress Notes (Addendum)
Anesthesia Chart Review:  Pt is 74 year old male scheduled for C4-5, C5-6, C6-7 ACDF on 03/20/2015 with Dr. Annette Stable.   PMH includes: HTN, COPD, prostate cancer. Never smoker. BMI 30.   Medications include: albuterol, prilosec.   Preoperative labs reviewed.    EKG 03/09/2015: NSR.   Nuclear stress test 10/19/2009 [facility]): -The nuclear images reveal no significant abnormalities -Wall motion is normal -There is no scar or ischemia -this is a normal stress nuclear scan.   If no changes, I anticipate pt can proceed with surgery as scheduled.   Willeen Cass, FNP-BC [facility]/Anesthesiology Phone: [phone] 03/12/2015 1:23 PM  Addendum:  Records received from pt's pulmonologist, Dr. Rogelia Mire at the [facility]. Last office visit 08/16/14 for COPD exacerbation. At that time pt was recovering well, sx improved. F/u recommended in 1 year.   Chest CT 07/19/2014: Mild bibasilar atelectasis.   If no changes, I anticipate pt can proceed with surgery as scheduled.   Willeen Cass, FNP-BC [facility]/Anesthesiology Phone: [phone] 03/15/2015 12:08 PM

## 2015-03-20 ENCOUNTER — Inpatient Hospital Stay (HOSPITAL_COMMUNITY): Payer: Medicare Other

## 2015-03-20 ENCOUNTER — Inpatient Hospital Stay (HOSPITAL_COMMUNITY): Payer: Medicare Other | Admitting: Certified Registered"

## 2015-03-20 ENCOUNTER — Inpatient Hospital Stay (HOSPITAL_COMMUNITY): Payer: Medicare Other | Admitting: Emergency Medicine

## 2015-03-20 ENCOUNTER — Encounter (HOSPITAL_COMMUNITY)
Admission: RE | Disposition: A | Discharge status: Home / Self Care | Payer: Self-pay | Source: Ambulatory Visit | Attending: Neurosurgery

## 2015-03-20 ENCOUNTER — Inpatient Hospital Stay (HOSPITAL_COMMUNITY)
Admission: RE | Admit: 2015-03-20 | Discharge: 2015-03-21 | DRG: 472 | Disposition: A | Discharge status: Home / Self Care | Payer: Medicare Other | Source: Ambulatory Visit | Attending: Neurosurgery | Admitting: Neurosurgery

## 2015-03-20 ENCOUNTER — Encounter (HOSPITAL_COMMUNITY): Payer: Self-pay | Admitting: Certified Registered"

## 2015-03-20 DIAGNOSIS — K219 Gastro-esophageal reflux disease without esophagitis: Secondary | ICD-10-CM | POA: Diagnosis present

## 2015-03-20 DIAGNOSIS — Z8546 Personal history of malignant neoplasm of prostate: Secondary | ICD-10-CM

## 2015-03-20 DIAGNOSIS — Z79899 Other long term (current) drug therapy: Secondary | ICD-10-CM

## 2015-03-20 DIAGNOSIS — J449 Chronic obstructive pulmonary disease, unspecified: Secondary | ICD-10-CM | POA: Diagnosis present

## 2015-03-20 DIAGNOSIS — G9520 Unspecified cord compression: Secondary | ICD-10-CM | POA: Diagnosis present

## 2015-03-20 DIAGNOSIS — I10 Essential (primary) hypertension: Secondary | ICD-10-CM | POA: Diagnosis present

## 2015-03-20 DIAGNOSIS — M542 Cervicalgia: Secondary | ICD-10-CM

## 2015-03-20 DIAGNOSIS — Z885 Allergy status to narcotic agent status: Secondary | ICD-10-CM | POA: Diagnosis not present

## 2015-03-20 DIAGNOSIS — M4802 Spinal stenosis, cervical region: Secondary | ICD-10-CM | POA: Diagnosis present

## 2015-03-20 DIAGNOSIS — R55 Syncope and collapse: Secondary | ICD-10-CM | POA: Diagnosis not present

## 2015-03-20 DIAGNOSIS — M4722 Other spondylosis with radiculopathy, cervical region: Secondary | ICD-10-CM | POA: Diagnosis present

## 2015-03-20 HISTORY — PX: ANTERIOR CERVICAL DECOMP/DISCECTOMY FUSION: SHX1161

## 2015-03-20 SURGERY — ANTERIOR CERVICAL DECOMPRESSION/DISCECTOMY FUSION 3 LEVELS
Anesthesia: General | Site: Neck

## 2015-03-20 MED ORDER — GLYCOPYRROLATE 0.2 MG/ML IJ SOLN
INTRAMUSCULAR | Status: DC | PRN
  Administered 2015-03-20: 0.2 mg via INTRAVENOUS
  Administered 2015-03-20: 0.4 mg via INTRAVENOUS

## 2015-03-20 MED ORDER — CEFAZOLIN SODIUM-DEXTROSE 2-3 GM-% IV SOLR
2.0000 g | INTRAVENOUS | Status: AC
  Administered 2015-03-20: 2 g via INTRAVENOUS
  Filled 2015-03-20: qty 50

## 2015-03-20 MED ORDER — FENTANYL CITRATE (PF) 250 MCG/5ML IJ SOLN
INTRAMUSCULAR | Status: AC
  Filled 2015-03-20: qty 5

## 2015-03-20 MED ORDER — OMEGA-3-ACID ETHYL ESTERS 1 G PO CAPS
1.0000 | ORAL_CAPSULE | Freq: Two times a day (BID) | ORAL | Status: DC
  Filled 2015-03-20 (×4): qty 1

## 2015-03-20 MED ORDER — CYCLOBENZAPRINE HCL 10 MG PO TABS
10.0000 mg | ORAL_TABLET | Freq: Three times a day (TID) | ORAL | Status: DC | PRN
  Administered 2015-03-20 – 2015-03-21 (×2): 10 mg via ORAL
  Filled 2015-03-20 (×2): qty 1

## 2015-03-20 MED ORDER — LIDOCAINE HCL (CARDIAC) 20 MG/ML IV SOLN
INTRAVENOUS | Status: DC | PRN
  Administered 2015-03-20: 60 mg via INTRAVENOUS

## 2015-03-20 MED ORDER — PHENYLEPHRINE HCL 10 MG/ML IJ SOLN
INTRAMUSCULAR | Status: DC | PRN
  Administered 2015-03-20: 80 ug via INTRAVENOUS
  Administered 2015-03-20: 40 ug via INTRAVENOUS

## 2015-03-20 MED ORDER — DEXAMETHASONE SODIUM PHOSPHATE 4 MG/ML IJ SOLN
INTRAMUSCULAR | Status: DC | PRN
  Administered 2015-03-20: 8 mg via INTRAVENOUS

## 2015-03-20 MED ORDER — GLUCOSAMINE-CHONDROITIN 500-400 MG PO TABS: 1.0000 | ORAL_TABLET | Freq: Two times a day (BID) | ORAL | Status: DC

## 2015-03-20 MED ORDER — SODIUM CHLORIDE 0.9 % IJ SOLN
INTRAMUSCULAR | Status: AC
  Filled 2015-03-20: qty 10

## 2015-03-20 MED ORDER — SODIUM CHLORIDE 0.9 % IJ SOLN: 3.0000 mL | INTRAMUSCULAR | Status: DC | PRN

## 2015-03-20 MED ORDER — PHENYLEPHRINE 40 MCG/ML (10ML) SYRINGE FOR IV PUSH (FOR BLOOD PRESSURE SUPPORT)
PREFILLED_SYRINGE | INTRAVENOUS | Status: AC
  Filled 2015-03-20: qty 10

## 2015-03-20 MED ORDER — ACETAMINOPHEN 325 MG PO TABS: 325.0000 mg | ORAL_TABLET | ORAL | Status: DC | PRN

## 2015-03-20 MED ORDER — OXYCODONE HCL 5 MG PO TABS: 5.0000 mg | ORAL_TABLET | Freq: Once | ORAL | Status: DC | PRN

## 2015-03-20 MED ORDER — THROMBIN 20000 UNITS EX KIT
PACK | CUTANEOUS | Status: DC | PRN
  Administered 2015-03-20: 20 mL via TOPICAL

## 2015-03-20 MED ORDER — EPHEDRINE SULFATE 50 MG/ML IJ SOLN
INTRAMUSCULAR | Status: DC | PRN
  Administered 2015-03-20: 15 mg via INTRAVENOUS

## 2015-03-20 MED ORDER — 0.9 % SODIUM CHLORIDE (POUR BTL) OPTIME
TOPICAL | Status: DC | PRN
  Administered 2015-03-20: 1000 mL

## 2015-03-20 MED ORDER — LIDOCAINE HCL (CARDIAC) 20 MG/ML IV SOLN
INTRAVENOUS | Status: AC
  Filled 2015-03-20: qty 5

## 2015-03-20 MED ORDER — MENTHOL 3 MG MT LOZG: 1.0000 | LOZENGE | OROMUCOSAL | Status: DC | PRN

## 2015-03-20 MED ORDER — NEOSTIGMINE METHYLSULFATE 10 MG/10ML IV SOLN
INTRAVENOUS | Status: DC | PRN
  Administered 2015-03-20: 3 mg via INTRAVENOUS

## 2015-03-20 MED ORDER — DEXAMETHASONE SODIUM PHOSPHATE 4 MG/ML IJ SOLN
INTRAMUSCULAR | Status: AC
  Filled 2015-03-20: qty 2

## 2015-03-20 MED ORDER — ONDANSETRON HCL 4 MG/2ML IJ SOLN
INTRAMUSCULAR | Status: DC | PRN
  Administered 2015-03-20: 4 mg via INTRAVENOUS

## 2015-03-20 MED ORDER — OXYCODONE-ACETAMINOPHEN 5-325 MG PO TABS
1.0000 | ORAL_TABLET | ORAL | Status: DC | PRN
  Administered 2015-03-21: 2 via ORAL
  Filled 2015-03-20: qty 2

## 2015-03-20 MED ORDER — ADULT MULTIVITAMIN W/MINERALS CH
1.0000 | ORAL_TABLET | Freq: Every day | ORAL | Status: DC
  Filled 2015-03-20 (×2): qty 1

## 2015-03-20 MED ORDER — PHENOL 1.4 % MT LIQD: 1.0000 | OROMUCOSAL | Status: DC | PRN

## 2015-03-20 MED ORDER — ACETAMINOPHEN 325 MG PO TABS
650.0000 mg | ORAL_TABLET | ORAL | Status: DC | PRN
  Administered 2015-03-20 – 2015-03-21 (×2): 650 mg via ORAL
  Filled 2015-03-20 (×2): qty 2

## 2015-03-20 MED ORDER — CEFAZOLIN SODIUM 1-5 GM-% IV SOLN
1.0000 g | Freq: Three times a day (TID) | INTRAVENOUS | Status: AC
  Administered 2015-03-20 (×2): 1 g via INTRAVENOUS
  Filled 2015-03-20 (×3): qty 50

## 2015-03-20 MED ORDER — PROPOFOL 10 MG/ML IV BOLUS
INTRAVENOUS | Status: AC
  Filled 2015-03-20: qty 20

## 2015-03-20 MED ORDER — PROPOFOL 10 MG/ML IV BOLUS
INTRAVENOUS | Status: DC | PRN
  Administered 2015-03-20: 140 mg via INTRAVENOUS

## 2015-03-20 MED ORDER — HYDROMORPHONE HCL 1 MG/ML IJ SOLN
0.5000 mg | INTRAMUSCULAR | Status: DC | PRN
  Administered 2015-03-20: 1 mg via INTRAVENOUS
  Filled 2015-03-20: qty 1

## 2015-03-20 MED ORDER — PANTOPRAZOLE SODIUM 40 MG PO TBEC
40.0000 mg | DELAYED_RELEASE_TABLET | Freq: Every day | ORAL | Status: DC
  Filled 2015-03-20: qty 1

## 2015-03-20 MED ORDER — HYDROMORPHONE HCL 1 MG/ML IJ SOLN
INTRAMUSCULAR | Status: AC
  Filled 2015-03-20: qty 1

## 2015-03-20 MED ORDER — ROCURONIUM BROMIDE 50 MG/5ML IV SOLN
INTRAVENOUS | Status: AC
  Filled 2015-03-20: qty 1

## 2015-03-20 MED ORDER — ONDANSETRON HCL 4 MG/2ML IJ SOLN
4.0000 mg | INTRAMUSCULAR | Status: DC | PRN
  Administered 2015-03-21: 4 mg via INTRAVENOUS
  Filled 2015-03-20: qty 2

## 2015-03-20 MED ORDER — OCUVITE PO TABS
1.0000 | ORAL_TABLET | Freq: Every day | ORAL | Status: DC
  Filled 2015-03-20 (×2): qty 1

## 2015-03-20 MED ORDER — SODIUM CHLORIDE 0.9 % IR SOLN
Status: DC | PRN
  Administered 2015-03-20: 500 mL

## 2015-03-20 MED ORDER — MIDAZOLAM HCL 2 MG/2ML IJ SOLN
INTRAMUSCULAR | Status: AC
  Filled 2015-03-20: qty 4

## 2015-03-20 MED ORDER — SUCCINYLCHOLINE CHLORIDE 20 MG/ML IJ SOLN
INTRAMUSCULAR | Status: AC
  Filled 2015-03-20: qty 1

## 2015-03-20 MED ORDER — ACETAMINOPHEN 160 MG/5ML PO SOLN: 325.0000 mg | ORAL | Status: DC | PRN

## 2015-03-20 MED ORDER — HYDROMORPHONE HCL 1 MG/ML IJ SOLN
0.2500 mg | INTRAMUSCULAR | Status: DC | PRN
  Administered 2015-03-20 (×2): 0.5 mg via INTRAVENOUS

## 2015-03-20 MED ORDER — OXYCODONE HCL 5 MG/5ML PO SOLN: 5.0000 mg | Freq: Once | ORAL | Status: DC | PRN

## 2015-03-20 MED ORDER — ROCURONIUM BROMIDE 100 MG/10ML IV SOLN
INTRAVENOUS | Status: DC | PRN
  Administered 2015-03-20: 50 mg via INTRAVENOUS

## 2015-03-20 MED ORDER — ACETAMINOPHEN 650 MG RE SUPP: 650.0000 mg | RECTAL | Status: DC | PRN

## 2015-03-20 MED ORDER — MIDAZOLAM HCL 5 MG/5ML IJ SOLN
INTRAMUSCULAR | Status: DC | PRN
  Administered 2015-03-20: 2 mg via INTRAVENOUS

## 2015-03-20 MED ORDER — FENTANYL CITRATE (PF) 250 MCG/5ML IJ SOLN
INTRAMUSCULAR | Status: DC | PRN
  Administered 2015-03-20: 150 ug via INTRAVENOUS
  Administered 2015-03-20 (×4): 50 ug via INTRAVENOUS

## 2015-03-20 MED ORDER — SODIUM CHLORIDE 0.9 % IJ SOLN
3.0000 mL | Freq: Two times a day (BID) | INTRAMUSCULAR | Status: DC
  Administered 2015-03-20 (×2): 3 mL via INTRAVENOUS

## 2015-03-20 MED ORDER — LACTATED RINGERS IV SOLN
INTRAVENOUS | Status: DC | PRN
  Administered 2015-03-20 (×2): via INTRAVENOUS

## 2015-03-20 MED ORDER — HYDROCODONE-ACETAMINOPHEN 5-325 MG PO TABS: 1.0000 | ORAL_TABLET | ORAL | Status: DC | PRN

## 2015-03-20 MED ORDER — ALBUTEROL SULFATE (2.5 MG/3ML) 0.083% IN NEBU: 2.5000 mg | INHALATION_SOLUTION | Freq: Four times a day (QID) | RESPIRATORY_TRACT | Status: DC | PRN

## 2015-03-20 MED ORDER — EPHEDRINE SULFATE 50 MG/ML IJ SOLN
INTRAMUSCULAR | Status: AC
  Filled 2015-03-20: qty 1

## 2015-03-20 SURGICAL SUPPLY — 55 items
APL SKNCLS STERI-STRIP NONHPOA (GAUZE/BANDAGES/DRESSINGS) ×1
BAG DECANTER FOR FLEXI CONT (MISCELLANEOUS) ×3 IMPLANT
BENZOIN TINCTURE PRP APPL 2/3 (GAUZE/BANDAGES/DRESSINGS) ×3 IMPLANT
BIT DRILL 13 (BIT) ×1 IMPLANT
BIT DRILL 13MM (BIT) ×1
BRUSH SCRUB EZ PLAIN DRY (MISCELLANEOUS) ×3 IMPLANT
BUR MATCHSTICK NEURO 3.0 LAGG (BURR) ×3 IMPLANT
CAGE PEEK 6X14X11 (Cage) ×9 IMPLANT
CAGE SPNL 11X14X6XRADOPQ (Cage) IMPLANT
CANISTER SUCT 3000ML PPV (MISCELLANEOUS) ×3 IMPLANT
CLOSURE WOUND 1/2 X4 (GAUZE/BANDAGES/DRESSINGS) ×1
DRAPE C-ARM 42X72 X-RAY (DRAPES) ×6 IMPLANT
DRAPE LAPAROTOMY 100X72 PEDS (DRAPES) ×3 IMPLANT
DRAPE MICROSCOPE LEICA (MISCELLANEOUS) ×3 IMPLANT
DRAPE POUCH INSTRU U-SHP 10X18 (DRAPES) ×3 IMPLANT
DRSG OPSITE POSTOP 4X6 (GAUZE/BANDAGES/DRESSINGS) ×2 IMPLANT
DURAPREP 6ML APPLICATOR 50/CS (WOUND CARE) ×3 IMPLANT
ELECT COATED BLADE 2.86 ST (ELECTRODE) ×3 IMPLANT
ELECT REM PT RETURN 9FT ADLT (ELECTROSURGICAL) ×3
ELECTRODE REM PT RTRN 9FT ADLT (ELECTROSURGICAL) ×1 IMPLANT
GAUZE SPONGE 4X4 12PLY STRL (GAUZE/BANDAGES/DRESSINGS) ×3 IMPLANT
GAUZE SPONGE 4X4 16PLY XRAY LF (GAUZE/BANDAGES/DRESSINGS) IMPLANT
GLOVE ECLIPSE 9.0 STRL (GLOVE) ×3 IMPLANT
GLOVE EXAM NITRILE LRG STRL (GLOVE) IMPLANT
GLOVE EXAM NITRILE MD LF STRL (GLOVE) IMPLANT
GLOVE EXAM NITRILE XL STR (GLOVE) IMPLANT
GLOVE EXAM NITRILE XS STR PU (GLOVE) IMPLANT
GOWN STRL REUS W/ TWL LRG LVL3 (GOWN DISPOSABLE) IMPLANT
GOWN STRL REUS W/ TWL XL LVL3 (GOWN DISPOSABLE) IMPLANT
GOWN STRL REUS W/TWL 2XL LVL3 (GOWN DISPOSABLE) IMPLANT
GOWN STRL REUS W/TWL LRG LVL3 (GOWN DISPOSABLE)
GOWN STRL REUS W/TWL XL LVL3 (GOWN DISPOSABLE)
HALTER HD/CHIN CERV TRACTION D (MISCELLANEOUS) ×3 IMPLANT
KIT BASIN OR (CUSTOM PROCEDURE TRAY) ×3 IMPLANT
KIT ROOM TURNOVER OR (KITS) ×3 IMPLANT
NDL SPNL 20GX3.5 QUINCKE YW (NEEDLE) ×1 IMPLANT
NEEDLE SPNL 20GX3.5 QUINCKE YW (NEEDLE) ×3 IMPLANT
NS IRRIG 1000ML POUR BTL (IV SOLUTION) ×3 IMPLANT
PACK LAMINECTOMY NEURO (CUSTOM PROCEDURE TRAY) ×3 IMPLANT
PAD ARMBOARD 7.5X6 YLW CONV (MISCELLANEOUS) ×9 IMPLANT
PLATE 3 60XNS SPNE CVD ANT T (Plate) IMPLANT
PLATE 3 ATLANTIS TRANS (Plate) ×3 IMPLANT
RUBBERBAND STERILE (MISCELLANEOUS) ×6 IMPLANT
SCREW ST FIX 4 ATL 3120213 (Screw) ×16 IMPLANT
SPONGE INTESTINAL PEANUT (DISPOSABLE) ×3 IMPLANT
SPONGE SURGIFOAM ABS GEL 100 (HEMOSTASIS) ×3 IMPLANT
STRIP CLOSURE SKIN 1/2X4 (GAUZE/BANDAGES/DRESSINGS) ×2 IMPLANT
SUT VIC AB 3-0 SH 8-18 (SUTURE) ×3 IMPLANT
SUT VIC AB 4-0 RB1 18 (SUTURE) ×3 IMPLANT
TAPE CLOTH 4X10 WHT NS (GAUZE/BANDAGES/DRESSINGS) ×3 IMPLANT
TAPE STRIPS DRAPE STRL (GAUZE/BANDAGES/DRESSINGS) ×2 IMPLANT
TOWEL OR 17X24 6PK STRL BLUE (TOWEL DISPOSABLE) ×3 IMPLANT
TOWEL OR 17X26 10 PK STRL BLUE (TOWEL DISPOSABLE) ×3 IMPLANT
TRAP SPECIMEN MUCOUS 40CC (MISCELLANEOUS) ×3 IMPLANT
WATER STERILE IRR 1000ML POUR (IV SOLUTION) ×3 IMPLANT

## 2015-03-20 NOTE — Op Note (Signed)
Date of procedure: 03/20/2015  Date of dictation: Same  Service: Neurosurgery  Preoperative diagnosis: C4-5, C5-6, C6-7 spondylosis with stenosis and radiculopathy  Postoperative diagnosis: Same  Procedure Name: C4-5, C5-6, C6-7 anterior cervical discectomy with interbody fusion utilizing interbody peek cages and locally harvested autograft  Surgeon:Anysia Choi A.Mylan Schwarz, M.D.  Asst. Surgeon: None  Anesthesia: General  Indication: 74 year old male with severe intractable neck pain with bilateral upper extremity symptoms right greater than left. Workup demonstrates evidence of marked cervical spondylosis with stenosis worse at C5-6 but also present at C4-5 and C67. Patient presents now for three-level anterior cervical decompression infusion in hopes of improving his symptoms.  Operative note: After induction anesthesia, patient positioned supine with neck slightly extended and held in place with halter traction. Anterior cervical region prepped and draped sterilely. Incision made overlying C5-6. Dissection proceeds on the right side. Retractor placed. Fluoroscopy used. Levels confirmed. Disc spaces incised at all 3 levels. Discectomies then performed using pituitary rongeurs, Kerrison rongeurs, Karlin curettes, and high-speed drill. All elements the disc removed down to level posterior annulus. Microscope brought into the field use at the remainder of the discectomy. Remaining aspects of annulus and osteophytes removed using high-speed drill. Posterior longitudinal was elevated and resected these will fashion using Kerrison rongeurs. Underlying thecal sac was identified. Wide central decompression was then performed by undercutting the bodies of C4 and C5. Decompression then proceeded into each neural foramen. Wide anterior foraminotomies were performed on course exiting C5 nerve roots bilaterally. Procedure then repeated at C5-6 and C6-7 again without complication. Wound is then irrigated with and bike  solution. Gelfoam was placed topically for hemostasis then removed. 6 mm Metronic anatomic peek cages packed with locally harvested autograft were impacted into place recessed slightly from the intervertebral margin. All 3 cages were found to be well-positioned. A 60 mm Atlantis anterior cervical translational plate was then placed over the C4-C7 levels. This an attachment or fluoroscopic guidance using 13 L fixed angle screws to reach it all 4 levels. All 8 screws were given a final tightening and found to be solidly within the bone. Locking screws were engaged at all 4 levels. Final images revealed good position the bone graft and hardware at the proper upper level with normal alignment of the spine. Wounds and irrigated with MI solution. Hemostasis was assured with bipolar chart. Wounds and closed in typical fashion. Steri-Strips and sterile dressing were applied. There were no apparent complications. Patient tolerated the procedure well and he returns to the recovery room postop.

## 2015-03-20 NOTE — Anesthesia Procedure Notes (Signed)
Procedure Name: Intubation Date/Time: 03/20/2015 8:08 AM Performed by: Julian Reil Pre-anesthesia Checklist: Patient identified, Emergency Drugs available, Suction available and Patient being monitored Patient Re-evaluated:Patient Re-evaluated prior to inductionOxygen Delivery Method: Circle system utilized Preoxygenation: Pre-oxygenation with 100% oxygen Intubation Type: IV induction Ventilation: Mask ventilation without difficulty and Oral airway inserted - appropriate to patient size Laryngoscope Size: Mac and 4 Grade View: Grade I Tube type: Oral Tube size: 7.5 mm Number of attempts: 1 Airway Equipment and Method: Stylet Placement Confirmation: ETT inserted through vocal cords under direct vision,  positive ETCO2 and breath sounds checked- equal and bilateral Secured at: 22 cm Tube secured with: Tape Dental Injury: Teeth and Oropharynx as per pre-operative assessment

## 2015-03-20 NOTE — Anesthesia Postprocedure Evaluation (Signed)
  Anesthesia Post-op Note  Patient: Ryan Richards  Procedure(s) Performed: Procedure(s) with comments: C4-5 C5-6 C6-7 Anterior cervical decompression/diskectomy/fusion (N/A) - C4-5 C5-6 C6-7 Anterior cervical decompression/diskectomy/fusion  Patient Location: PACU  Anesthesia Type:General  Level of Consciousness: awake  Airway and Oxygen Therapy: Patient Spontanous Breathing  Post-op Pain: mild  Post-op Assessment: Post-op Vital signs reviewed, Patient's Cardiovascular Status Stable, Respiratory Function Stable, Patent Airway, No signs of Nausea or vomiting and Pain level controlled              Post-op Vital Signs: Reviewed and stable  Last Vitals:  Filed Vitals:   03/20/15 1111  BP: 168/81  Pulse: 66  Temp: 36.4 C  Resp: 18    Complications: No apparent anesthesia complications

## 2015-03-20 NOTE — Progress Notes (Signed)
Transported per Wayne, NT

## 2015-03-20 NOTE — Brief Op Note (Signed)
03/20/2015  9:52 AM  PATIENT:  Lerry Paterson Hollabaugh  74 y.o. male  PRE-OPERATIVE DIAGNOSIS:  Spinal stenosis, Cervical region  POST-OPERATIVE DIAGNOSIS:  Spinal stenosis, Cervical region  PROCEDURE:  Procedure(s) with comments: C4-5 C5-6 C6-7 Anterior cervical decompression/diskectomy/fusion (N/A) - C4-5 C5-6 C6-7 Anterior cervical decompression/diskectomy/fusion  SURGEON:  Surgeon(s) and Role:    * Earnie Larsson, MD - Primary  PHYSICIAN ASSISTANT:   ASSISTANTS:    ANESTHESIA:   general  EBL:  Total I/O In: 1400 [I.V.:1400] Out: 50 [Blood:50]  BLOOD ADMINISTERED:none  DRAINS: none   LOCAL MEDICATIONS USED:  NONE  SPECIMEN:  No Specimen  DISPOSITION OF SPECIMEN:  N/A  COUNTS:  YES  TOURNIQUET:  * No tourniquets in log *  DICTATION: .Dragon Dictation  PLAN OF CARE: Admit to inpatient   PATIENT DISPOSITION:  PACU - hemodynamically stable.   Delay start of Pharmacological VTE agent (>24hrs) due to surgical blood loss or risk of bleeding: yes

## 2015-03-20 NOTE — Progress Notes (Signed)
Orthopedic Tech Progress Note Patient Details:  Ryan Richards [birth year] [id number]  Ortho Devices Type of Ortho Device: Soft collar Ortho Device/Splint Location: neck Ortho Device/Splint Interventions: Ordered As ordered by Dr. Elvin So, Ryan Richards 03/20/2015, 7:50 AM

## 2015-03-20 NOTE — Anesthesia Preprocedure Evaluation (Signed)
Anesthesia Evaluation    Reviewed: Allergy & Precautions, NPO status , Patient's Chart, lab work & pertinent test results  History of Anesthesia Complications Negative for: history of anesthetic complications  Airway Mallampati: II  TM Distance: >3 FB Neck ROM: Full    Dental  (+) Teeth Intact,    Pulmonary shortness of breath, COPD,  COPD inhaler,    breath sounds clear to auscultation       Cardiovascular hypertension, (-) angina(-) Past MI and (-) CHF  Rhythm:Regular     Neuro/Psych  Headaches,  Neuromuscular disease negative psych ROS   GI/Hepatic Neg liver ROS, GERD  Medicated and Controlled,  Endo/Other  negative endocrine ROS  Renal/GU negative Renal ROS     Musculoskeletal  (+) Arthritis ,   Abdominal   Peds  Hematology negative hematology ROS (+)   Anesthesia Other Findings   Reproductive/Obstetrics                             Anesthesia Physical Anesthesia Plan  ASA: II  Anesthesia Plan: General   Post-op Pain Management:    Induction: Intravenous  Airway Management Planned: Oral ETT  Additional Equipment: None  Intra-op Plan:   Post-operative Plan: Extubation in OR  Informed Consent: I have reviewed the patients History and Physical, chart, labs and discussed the procedure including the risks, benefits and alternatives for the proposed anesthesia with the patient or authorized representative who has indicated his/her understanding and acceptance.   Dental advisory given  Plan Discussed with: CRNA and Surgeon  Anesthesia Plan Comments:         Anesthesia Quick Evaluation

## 2015-03-20 NOTE — Progress Notes (Signed)
PHARMACIST - PHYSICIAN ORDER COMMUNICATION  CONCERNING: P&T Medication Policy on Herbal Medications  DESCRIPTION:  This patient's order for:  Glucosamine Chondroitin  has been noted.  This product(s) is classified as an "herbal" or natural product. Due to a lack of definitive safety studies or FDA approval, nonstandard manufacturing practices, plus the potential risk of unknown drug-drug interactions while on inpatient medications, the Pharmacy and Therapeutics Committee does not permit the use of "herbal" or natural products of this type within [facility].   ACTION TAKEN: The pharmacy department is unable to verify this order at this time and your patient has been informed of this safety policy. Please reevaluate patient's clinical condition at discharge and address if the herbal or natural product(s) should be resumed at that time.   Gracy Bruins, PharmD Clinical Pharmacist [facility]

## 2015-03-20 NOTE — Transfer of Care (Signed)
Immediate Anesthesia Transfer of Care Note  Patient: Ryan Richards  Procedure(s) Performed: Procedure(s) with comments: C4-5 C5-6 C6-7 Anterior cervical decompression/diskectomy/fusion (N/A) - C4-5 C5-6 C6-7 Anterior cervical decompression/diskectomy/fusion  Patient Location: PACU  Anesthesia Type:General  Level of Consciousness: awake, alert , oriented and patient cooperative  Airway & Oxygen Therapy: Patient Spontanous Breathing and Patient connected to nasal cannula oxygen  Post-op Assessment: Report given to RN, Post -op Vital signs reviewed and stable and Patient moving all extremities  Post vital signs: Reviewed and stable  Last Vitals:  Filed Vitals:   03/20/15 0702  BP: 167/92  Pulse: 58  Temp: 36.9 C  Resp: 20    Complications: No apparent anesthesia complications

## 2015-03-20 NOTE — H&P (Signed)
Ryan Richards is an 74 y.o. male.   Chief Complaint: Neck pain HPI: 74 year old male with progressive intractable neck pain with radiation of both upper extremities somewhat greater on the right. Patient has failed conservative management. Workup demonstrates evidence of significant spondylosis with foraminal stenosis on the left at C4-5 central stenosis with spinal cord compression at C5-6 and bilateral neural foraminal stenosis at C6-7. Patient has spontaneous fusion at C3-4. He presents now for three-level anterior cervical decompression and fusion from C4-C7.  Past Medical History  Diagnosis Date  . Prostate cancer ([place])   . Bronchitis   . Acid reflux   . Family history of adverse reaction to anesthesia     sisters are difficult to arouse  . COPD (chronic obstructive pulmonary disease) ([facility])     minor  . Hypertension     pt. states it is sometimes elevated  . Shortness of breath dyspnea   . History of pneumonia 2015  . Urinary frequency   . Headache   . Arthritis   . Joint inflammation     Past Surgical History  Procedure Laterality Date  . Prostate surgery      removal  . Knee surgery Left 1972  . Back surgery      x4  . Cardiac catheterization  1992    pt. states that everything was fine  . Colonoscopy      History reviewed. No pertinent family history. Social History:  reports that he has never smoked. He does not have any smokeless tobacco history on file. He reports that he does not drink alcohol or use illicit drugs.  Allergies:  Allergies  Allergen Reactions  . Codeine     Itch, and bad headache  . Morphine And Related     Itch, and bad headache     Medications Prior to Admission  Medication Sig Dispense Refill  . albuterol (PROVENTIL HFA;VENTOLIN HFA) 108 (90 BASE) MCG/ACT inhaler Inhale 2 puffs into the lungs every 6 (six) hours as needed for wheezing or shortness of breath.    . beta carotene w/minerals (OCUVITE) tablet Take 1 tablet by mouth  daily.    . diphenhydramine-acetaminophen (TYLENOL PM) 25-500 MG TABS tablet Take 1 tablet by mouth at bedtime as needed (sleep).    Marland Kitchen glucosamine-chondroitin (COSAMIN DS) 500-400 MG tablet Take 1 tablet by mouth 2 (two) times daily.    . Menthol, Topical Analgesic, ([employer] SORE MUSCLE RUB EX) Apply 1 application topically 2 (two) times daily as needed.    . Multiple Vitamin (MULTIVITAMIN) tablet Take 1 tablet by mouth daily.    . Omega-3 Fatty Acids (FISH OIL) 1200 MG CAPS Take 2 capsules by mouth 2 (two) times daily.     Marland Kitchen omeprazole (PRILOSEC) 20 MG capsule Take 20 mg by mouth daily.    Marland Kitchen OVER THE COUNTER MEDICATION Take 2 capsules by mouth 3 (three) times daily. Lung Support      No results found for this or any previous visit (from the past 48 hour(s)). No results found.    Blood pressure 167/92, pulse 58, temperature 98.4 F (36.9 C), resp. rate 20, height 6' (1.829 m), weight 100.699 kg (222 lb), SpO2 97 %. the patient is awake and alert. He is oriented and appropriate. Cranial nerve function intact. Motor and sensory function extremities normal. Reflexes hypoactive but symmetric. No evidence of long track signs. Gait posture normal. Examination head ears eyes and throat is unremarkable. Chest and abdomen are benign. Extremities free from injury  deformity.   Assessment/Plan C4-5, C5-6, C6-7 spondylosis with stenosis with neck pain and radiculopathy. Plan C4-5, C5-6, C6-7 anterior cervical discectomy and fusion with interbody cages, locally harvested autograft, and anterior plate instrumentation. Risks and benefits been explained. Patient wishes to proceed.  Texanna Hilburn A 03/20/2015, 7:31 AM

## 2015-03-20 NOTE — Plan of Care (Signed)
Problem: Consults Goal: Diagnosis - Spinal Surgery Outcome: Completed/Met Date Met:  03/20/15 Cervical Spine Fusion

## 2015-03-20 NOTE — Progress Notes (Signed)
Utilization review completed.  

## 2015-03-21 ENCOUNTER — Encounter (HOSPITAL_COMMUNITY): Payer: Self-pay | Admitting: Neurosurgery

## 2015-03-21 LAB — GLUCOSE, CAPILLARY: Glucose-Capillary: 116 mg/dL — ABNORMAL HIGH (ref 65–99)

## 2015-03-21 MED ORDER — SODIUM CHLORIDE 0.9 % IV BOLUS (SEPSIS): 250.0000 mL | Freq: Once | INTRAVENOUS | Status: DC

## 2015-03-21 MED ORDER — CYCLOBENZAPRINE HCL 10 MG PO TABS: 10.0000 mg | ORAL_TABLET | Freq: Three times a day (TID) | ORAL | Status: DC | PRN

## 2015-03-21 MED ORDER — OXYCODONE-ACETAMINOPHEN 5-325 MG PO TABS: 1.0000 | ORAL_TABLET | ORAL | Status: DC | PRN

## 2015-03-21 NOTE — Progress Notes (Signed)
Patient had acute episode of syncope-  unresponsive for about 10 seconds. Per wife, patient c/o nausea before that. Vital signs taken  and O2 at 2L via n/c started. Rapid Response called for further eval. EKG done- SB 58 HR, no ectopy noted. NS 250 ml bolus given, Zofran administered for c/o nausea. Denies h/a nor pain in surgical site. Left arm weakness unchanged, however expressed to RN that it was feeling better/ stronger around 2200. Still c/o feeling weak. Verbalized relief from nausea after zofran, vital signs remain stable after bolus. On call- Dr Annette Stable notified and made aware about pt' syncopal episode. No further orders received. RR RN aware of MD's notification and patient's  Most recent status. Will cont   03/21/15 0100 03/21/15 0205  Vitals  Temp 97.7 F (36.5 C) 98.1 F (36.7 C)  Temp Source Oral Oral  BP 138/76 mmHg (!) 143/73 mmHg  BP Location Right Arm Right Arm  BP Method Automatic Automatic  Patient Position (if appropriate) Lying Lying  Pulse Rate (!) 51 64  Pulse Rate Source Dinamap Dinamap  Resp 20 20  Oxygen Therapy  SpO2 93 % 98 %  O2 Device Room Air Nasal Cannula  O2 Flow Rate (L/min) --  2 L/min   to monitor pt closely.

## 2015-03-21 NOTE — Progress Notes (Signed)
Pt and wife given D/C instructions with Rx's, verbal understanding was provided. Pt's incision is clean and dry with no sign of infection. Pt's IV was removed prior to D/C. Pt D/C home via wheelchair @ 1240 per MD order. Pt is stable @ D/C and has no other needs at this time. Holli Humbles, RN

## 2015-03-21 NOTE — Discharge Summary (Signed)
Physician Discharge Summary  Patient ID: Ryan Richards MRN: [id number] DOB/AGE: [birth year] 73 y.o.  Admit date: 03/20/2015 Discharge date: 03/21/2015  Admission Diagnoses:  Discharge Diagnoses:  Active Problems:   Cervical stenosis of spinal canal   Discharged Condition: good  Hospital Course: Patient was admitted to the hospital where he underwent an uncomplicated Q6-5, H8-4, O9-6 anterior cervical decompression and fusion. Postoperatively he is doing reasonably well. He has a little bit of left shoulder weakness otherwise neurologically intact. He had a brief episode of vasovagal syncope last night. Currently feels good. He has no lingering effects from this. Currently his pain is well-controlled. He is mobilizing without difficulty. He is ready for discharge home.  Consults:   Significant Diagnostic Studies:   Treatments:   Discharge Exam: Blood pressure 145/71, pulse 64, temperature 98 F (36.7 C), temperature source Oral, resp. rate 18, height 6' (1.829 m), weight 100.699 kg (222 lb), SpO2 99 %. Awake and alert. Oriented and appropriate. Cranial nerve function intact. Motor examination intact except left deltoid 4 minus over 5 left biceps 4+ over 5 remainder of motor strength intact. Sensory examination nonfocal. Wound clean and dry. Chest and abdomen benign.  Disposition: 01-Home or Self Care     Medication List    TAKE these medications        albuterol 108 (90 BASE) MCG/ACT inhaler  Commonly known as:  PROVENTIL HFA;VENTOLIN HFA  Inhale 2 puffs into the lungs every 6 (six) hours as needed for wheezing or shortness of breath.     beta carotene w/minerals tablet  Take 1 tablet by mouth daily.     COSAMIN DS 500-400 MG tablet  Generic drug:  glucosamine-chondroitin  Take 1 tablet by mouth 2 (two) times daily.     CVS SORE MUSCLE RUB EX  Apply 1 application topically 2 (two) times daily as needed.     cyclobenzaprine 10 MG tablet  Commonly known as:   FLEXERIL  Take 1 tablet (10 mg total) by mouth 3 (three) times daily as needed for muscle spasms.     diphenhydramine-acetaminophen 25-500 MG Tabs tablet  Commonly known as:  TYLENOL PM  Take 1 tablet by mouth at bedtime as needed (sleep).     Fish Oil 1200 MG Caps  Take 2 capsules by mouth 2 (two) times daily.     multivitamin tablet  Take 1 tablet by mouth daily.     omeprazole 20 MG capsule  Commonly known as:  PRILOSEC  Take 20 mg by mouth daily.     OVER THE COUNTER MEDICATION  Take 2 capsules by mouth 3 (three) times daily. Lung Support     oxyCODONE-acetaminophen 5-325 MG tablet  Commonly known as:  PERCOCET/ROXICET  Take 1-2 tablets by mouth every 4 (four) hours as needed for moderate pain.           Follow-up Information    Follow up with Charlie Pitter, MD.   Specialty:  Neurosurgery   Contact information:   [address] [place] [address] [phone]       Signed: Charlie Pitter 03/21/2015, 10:48 AM

## 2015-03-21 NOTE — Progress Notes (Signed)
Called per floor RN for Pt with syncopal episode at side of bed. Upon my arrival Pt resting in bed, VSS, HR 50-60. Pt pale, warm to touch, lethargic oriented X 4 denies pain initially. Per Pt wife at bedside Pt complained of severe nausea just prior to syncope. EKG completed yielding Sinus Loletha Grayer. 250 NS bolus started. Zofran 4 mg IVP given for mild nausea. Dr. Annette Stable notified per floor RN, no additional orders. RN advised to monitor Pt closely and notify myself and Provider for worsening changes.

## 2015-03-21 NOTE — Discharge Instructions (Signed)

## 2015-04-18 ENCOUNTER — Emergency Department (HOSPITAL_COMMUNITY)
Admission: EM | Admit: 2015-04-18 | Discharge: 2015-04-18 | Disposition: A | Discharge status: Home / Self Care | Payer: Medicare Other | Attending: Emergency Medicine | Admitting: Emergency Medicine

## 2015-04-18 ENCOUNTER — Encounter (HOSPITAL_COMMUNITY): Payer: Self-pay | Admitting: Emergency Medicine

## 2015-04-18 DIAGNOSIS — K219 Gastro-esophageal reflux disease without esophagitis: Secondary | ICD-10-CM | POA: Insufficient documentation

## 2015-04-18 DIAGNOSIS — M25512 Pain in left shoulder: Secondary | ICD-10-CM | POA: Insufficient documentation

## 2015-04-18 DIAGNOSIS — R531 Weakness: Secondary | ICD-10-CM | POA: Insufficient documentation

## 2015-04-18 DIAGNOSIS — J449 Chronic obstructive pulmonary disease, unspecified: Secondary | ICD-10-CM | POA: Diagnosis not present

## 2015-04-18 DIAGNOSIS — Z8546 Personal history of malignant neoplasm of prostate: Secondary | ICD-10-CM | POA: Insufficient documentation

## 2015-04-18 DIAGNOSIS — Z79899 Other long term (current) drug therapy: Secondary | ICD-10-CM | POA: Insufficient documentation

## 2015-04-18 DIAGNOSIS — Z8701 Personal history of pneumonia (recurrent): Secondary | ICD-10-CM | POA: Diagnosis not present

## 2015-04-18 DIAGNOSIS — R42 Dizziness and giddiness: Secondary | ICD-10-CM | POA: Insufficient documentation

## 2015-04-18 DIAGNOSIS — I10 Essential (primary) hypertension: Secondary | ICD-10-CM | POA: Insufficient documentation

## 2015-04-18 LAB — CBC
HEMATOCRIT: 46.3 % (ref 39.0–52.0)
HEMOGLOBIN: 16.3 g/dL (ref 13.0–17.0)
MCH: 33.2 pg (ref 26.0–34.0)
MCHC: 35.2 g/dL (ref 30.0–36.0)
MCV: 94.3 fL (ref 78.0–100.0)
Platelets: 235 10*3/uL (ref 150–400)
RBC: 4.91 MIL/uL (ref 4.22–5.81)
RDW: 12.7 % (ref 11.5–15.5)
WBC: 9.2 10*3/uL (ref 4.0–10.5)

## 2015-04-18 LAB — BASIC METABOLIC PANEL
ANION GAP: 8 (ref 5–15)
BUN: 17 mg/dL (ref 6–20)
CHLORIDE: 102 mmol/L (ref 101–111)
CO2: 25 mmol/L (ref 22–32)
Calcium: 8.9 mg/dL (ref 8.9–10.3)
Creatinine, Ser: 0.99 mg/dL (ref 0.61–1.24)
GFR calc Af Amer: >60 mL/min (ref >60)
GLUCOSE: 100 mg/dL — AB (ref 65–99)
POTASSIUM: 4 mmol/L (ref 3.5–5.1)
Sodium: 135 mmol/L (ref 135–145)

## 2015-04-18 LAB — URINALYSIS, ROUTINE W REFLEX MICROSCOPIC
Bilirubin Urine: NEGATIVE
Glucose, UA: NEGATIVE mg/dL
Hgb urine dipstick: NEGATIVE
Ketones, ur: NEGATIVE mg/dL
Leukocytes, UA: NEGATIVE
NITRITE: NEGATIVE
PH: 7 (ref 5.0–8.0)
Protein, ur: NEGATIVE mg/dL
SPECIFIC GRAVITY, URINE: 1.014 (ref 1.005–1.030)
UROBILINOGEN UA: 0.2 mg/dL (ref 0.0–1.0)

## 2015-04-18 MED ORDER — SODIUM CHLORIDE 0.9 % IV BOLUS (SEPSIS)
1000.0000 mL | Freq: Once | INTRAVENOUS | Status: AC
  Administered 2015-04-18: 1000 mL via INTRAVENOUS

## 2015-04-18 NOTE — Discharge Instructions (Signed)
Fatigue  Fatigue is feeling tired all of the time, a lack of energy, or a lack of motivation. Occasional or mild fatigue is often a normal response to activity or life in general. However, long-lasting (chronic) or extreme fatigue may indicate an underlying medical condition.  HOME CARE INSTRUCTIONS   Watch your fatigue for any changes. The following actions may help to lessen any discomfort you are feeling:  · Talk to your health care provider about how much sleep you need each night. Try to get the required amount every night.  · Take medicines only as directed by your health care provider.  · Eat a healthy and nutritious diet. Ask your health care provider if you need help changing your diet.  · Drink enough fluid to keep your urine clear or pale yellow.  · Practice ways of relaxing, such as yoga, meditation, massage therapy, or acupuncture.  · Exercise regularly.    · Change situations that cause you stress. Try to keep your work and personal routine reasonable.  · Do not abuse illegal drugs.  · Limit alcohol intake to no more than 1 drink per day for nonpregnant women and 2 drinks per day for men. One drink equals 12 ounces of beer, 5 ounces of wine, or 1½ ounces of hard liquor.  · Take a multivitamin, if directed by your health care provider.  SEEK MEDICAL CARE IF:   · Your fatigue does not get better.  · You have a fever.    · You have unintentional weight loss or gain.  · You have headaches.    · You have difficulty:      Falling asleep.    Sleeping throughout the night.  · You feel angry, guilty, anxious, or sad.     · You are unable to have a bowel movement (constipation).    · You skin is dry.     · Your legs or another part of your body is swollen.    SEEK IMMEDIATE MEDICAL CARE IF:   · You feel confused.    · Your vision is blurry.  · You feel faint or pass out.    · You have a severe headache.    · You have severe abdominal, pelvic, or back pain.    · You have chest pain, shortness of breath, or an  irregular or fast heartbeat.    · You are unable to urinate or you urinate less than normal.    · You develop abnormal bleeding, such as bleeding from the rectum, vagina, nose, lungs, or nipples.  · You vomit blood.     · You have thoughts about harming yourself or committing suicide.    · You are worried that you might harm someone else.       This information is not intended to replace advice given to you by your health care provider. Make sure you discuss any questions you have with your health care provider.     Document Released: 03/23/2007 Document Revised: 06/16/2014 Document Reviewed: 09/27/2013  [employer]vier Interactive Patient Education ©2016 [employer]

## 2015-04-18 NOTE — ED Provider Notes (Addendum)
CSN: [id number]     Arrival date & time 04/18/15  1116 History   First MD Initiated Contact with Patient 04/18/15 1121     Chief Complaint  Patient presents with  . Dizziness  . Weakness     (Consider location/radiation/quality/duration/timing/severity/associated sxs/prior Treatment) HPI   Ryan Richards is a 74 y.o. male who presents for evaluation of generalized weakness, present for 1 month, since his surgery. He also has pain in his left shoulder and difficulty moving his left arm, to eat with. The shoulder and arm pain seemed to have started after the surgery. He saw his surgeon, 2  weeks ago, and was given reassurance that he should do his normal activities and follow-up for a checkup tomorrow as planned. He has also been treated with an injection in the left shoulder, and prednisone orally, by a orthopedic doctor because of the shoulder discomfort. He finished a taper course of prednisone, 3 days ago. He is taking his usual medications. He states that he is eating, but less than usual. He does not have dysuria, urinary frequency, constipation or diarrhea.  Past Medical History  Diagnosis Date  . Prostate cancer ([place])   . Bronchitis   . Acid reflux   . Family history of adverse reaction to anesthesia     sisters are difficult to arouse  . COPD (chronic obstructive pulmonary disease) (HCC)     minor  . Hypertension     pt. states it is sometimes elevated  . Shortness of breath dyspnea   . History of pneumonia 2015  . Urinary frequency   . Headache   . Arthritis   . Joint inflammation    Past Surgical History  Procedure Laterality Date  . Prostate surgery      removal  . Knee surgery Left 1972  . Back surgery      x4  . Cardiac catheterization  1992    pt. states that everything was fine  . Colonoscopy    . Anterior cervical decomp/discectomy fusion N/A 03/20/2015    Procedure: C4-5 C5-6 C6-7 Anterior cervical decompression/diskectomy/fusion;  Surgeon: Earnie Larsson,  MD;  Location: [facility];  Service: Neurosurgery;  Laterality: N/A;  C4-5 C5-6 C6-7 Anterior cervical decompression/diskectomy/fusion   History reviewed. No pertinent family history. Social History  Substance Use Topics  . Smoking status: Never Smoker   . Smokeless tobacco: None  . Alcohol Use: No    Review of Systems  All other systems reviewed and are negative.     Allergies  Codeine and Morphine and related  Home Medications   Prior to Admission medications   Medication Sig Start Date End Date Taking? Authorizing Provider  albuterol (PROVENTIL HFA;VENTOLIN HFA) 108 (90 BASE) MCG/ACT inhaler Inhale 2 puffs into the lungs every 6 (six) hours as needed for wheezing or shortness of breath.   Yes Historical Provider, MD  beta carotene w/minerals (OCUVITE) tablet Take 1 tablet by mouth daily.   Yes Historical Provider, MD  cyclobenzaprine (FLEXERIL) 10 MG tablet Take 1 tablet (10 mg total) by mouth 3 (three) times daily as needed for muscle spasms. 03/21/15  Yes Earnie Larsson, MD  Multiple Vitamin (MULTIVITAMIN) tablet Take 1 tablet by mouth daily.   Yes Historical Provider, MD  Omega-3 Fatty Acids (FISH OIL) 1200 MG CAPS Take 2 capsules by mouth 2 (two) times daily.    Yes Historical Provider, MD  omeprazole (PRILOSEC) 20 MG capsule Take 20 mg by mouth daily.   Yes Historical Provider, MD  OVER THE COUNTER MEDICATION Take 2 capsules by mouth 3 (three) times daily. Lung Support   Yes Historical Provider, MD  oxyCODONE-acetaminophen (PERCOCET/ROXICET) 5-325 MG tablet Take 1-2 tablets by mouth every 4 (four) hours as needed for moderate pain. 03/21/15  Yes Earnie Larsson, MD  diphenhydramine-acetaminophen (TYLENOL PM) 25-500 MG TABS tablet Take 1 tablet by mouth at bedtime as needed (sleep).    Historical Provider, MD  glucosamine-chondroitin (COSAMIN DS) 500-400 MG tablet Take 1 tablet by mouth 2 (two) times daily.    Historical Provider, MD  Menthol, Topical Analgesic, ([employer] SORE MUSCLE RUB  EX) Apply 1 application topically 2 (two) times daily as needed. For pain    Historical Provider, MD   BP 138/87 mmHg  Pulse 76  Temp(Src) 98.7 F (37.1 C) (Oral)  Resp 14  Ht 6' (1.829 m)  Wt 220 lb (99.791 kg)  BMI 29.83 kg/m2  SpO2 94% Physical Exam  Constitutional: He is oriented to person, place, and time. He appears well-developed and well-nourished.  HENT:  Head: Normocephalic and atraumatic.  Right Ear: External ear normal.  Left Ear: External ear normal.  Eyes: Conjunctivae and EOM are normal. Pupils are equal, round, and reactive to light.  Neck: Normal range of motion and phonation normal. Neck supple.  Cardiovascular: Normal rate, regular rhythm and normal heart sounds.   Pulmonary/Chest: Effort normal and breath sounds normal. He exhibits no bony tenderness.  Abdominal: Soft. There is no tenderness.  Musculoskeletal:  Decreased range of motion secondary to left shoulder pain. No palpable deformity or spasm of muscles of the left shoulder girdle.  Neurological: He is alert and oriented to person, place, and time. No cranial nerve deficit or sensory deficit. He exhibits normal muscle tone. Coordination normal.  Skin: Skin is warm, dry and intact.  Psychiatric: He has a normal mood and affect. His behavior is normal. Judgment and thought content normal.  Nursing note and vitals reviewed.   ED Course  Procedures (including critical care time)  Medications  sodium chloride 0.9 % bolus 1,000 mL (0 mLs Intravenous Stopped 04/18/15 1632)    Patient Vitals for the past 24 hrs:  BP Temp Temp src Pulse Resp SpO2 Height Weight  04/18/15 1630 137/80 mmHg - - 74 15 96 % - -  04/18/15 1615 134/78 mmHg - - 75 13 95 % - -  04/18/15 1600 115/73 mmHg - - 71 18 94 % - -  04/18/15 1545 131/73 mmHg - - 71 15 95 % - -  04/18/15 1530 140/75 mmHg - - 71 17 98 % - -  04/18/15 1515 136/78 mmHg - - 71 18 96 % - -  04/18/15 1500 142/79 mmHg - - 76 16 97 % - -  04/18/15 1445 150/87 mmHg -  - 69 18 97 % - -  04/18/15 1430 136/83 mmHg - - 65 14 97 % - -  04/18/15 1415 143/79 mmHg - - 68 12 98 % - -  04/18/15 1330 138/87 mmHg - - 76 14 94 % - -  04/18/15 1315 131/84 mmHg - - 73 13 95 % - -  04/18/15 1300 132/87 mmHg - - 72 12 96 % - -  04/18/15 1245 135/83 mmHg - - 71 12 96 % - -  04/18/15 1230 138/85 mmHg - - 75 16 94 % - -  04/18/15 1215 134/85 mmHg - - 73 15 95 % - -  04/18/15 1200 151/84 mmHg - - 74 19 96 % - -  04/18/15 1132 152/77 mmHg 98.7 F (37.1 C) Oral 81 15 96 % - -  04/18/15 1127 - - - - - - 6' (1.829 m) 220 lb (99.791 kg)  04/18/15 1123 - - - - - 94 % - -    4:37 PM Reevaluation with update and discussion. After initial assessment and treatment, an updated evaluation reveals he feels better at this time. Findings discussed with patient and wife, all questions answered. Darleny Sem L    Labs Review Labs Reviewed  BASIC METABOLIC PANEL - Abnormal; Notable for the following:    Glucose, Bld 100 (*)    All other components within normal limits  CBC  URINALYSIS, ROUTINE W REFLEX MICROSCOPIC (NOT AT [facility])    Imaging Review No results found. I have personally reviewed and evaluated these images and lab results as part of my medical decision-making.   EKG Interpretation   Date/Time:  Wednesday April 18 2015 11:27:36 EST Ventricular Rate:  86 PR Interval:  189 QRS Duration: 94 QT Interval:  344 QTC Calculation: 411 R Axis:   -29 Text Interpretation:  Sinus rhythm Borderline left axis deviation Abnormal  R-wave progression, early transition since last tracing no significant  change Confirmed by Eulis Foster  MD, Nari Vannatter ([id number]) on 04/18/2015 11:34:47 AM      MDM   Final diagnoses:  Weakness  Left shoulder pain    Nonspecific weakness for 1 month. Doubt surgical complication. Doubt UTI, metabolic instability or CVA.  Nursing Notes Reviewed/ Care Coordinated Applicable Imaging Reviewed Interpretation of Laboratory Data incorporated into ED  treatment  The patient appears reasonably screened and/or stabilized for discharge and I doubt any other medical condition or other [facility] requiring further screening, evaluation, or treatment in the ED at this time prior to discharge.  Plan: Home Medications- usual; Home Treatments- rest; return here if the recommended treatment, does not improve the symptoms; Recommended follow up- Neurosurg. On 02/17/15 as scheduled     Daleen Bo, MD 04/19/15 1906  Daleen Bo, MD 05/05/15 [phone]

## 2015-04-18 NOTE — ED Notes (Signed)
Pt here via EMS from whome with c/o generalized weakness, dizziness and lightheadedness x 3 days. Pt had neck surgery a month ago and has had left arm weakness since then. Pt also reports nausea and vomiting. Denies CP, SOB, falls.

## 2015-08-01 ENCOUNTER — Encounter (HOSPITAL_COMMUNITY): Payer: Self-pay

## 2015-08-01 ENCOUNTER — Emergency Department (HOSPITAL_COMMUNITY)
Admission: EM | Admit: 2015-08-01 | Discharge: 2015-08-01 | Disposition: A | Discharge status: Home / Self Care | Payer: Medicare Other | Attending: Emergency Medicine | Admitting: Emergency Medicine

## 2015-08-01 ENCOUNTER — Emergency Department (HOSPITAL_COMMUNITY): Payer: Medicare Other

## 2015-08-01 DIAGNOSIS — I10 Essential (primary) hypertension: Secondary | ICD-10-CM | POA: Insufficient documentation

## 2015-08-01 DIAGNOSIS — R531 Weakness: Secondary | ICD-10-CM | POA: Diagnosis not present

## 2015-08-01 DIAGNOSIS — F449 Dissociative and conversion disorder, unspecified: Secondary | ICD-10-CM | POA: Diagnosis not present

## 2015-08-01 DIAGNOSIS — R070 Pain in throat: Secondary | ICD-10-CM | POA: Diagnosis not present

## 2015-08-01 DIAGNOSIS — R079 Chest pain, unspecified: Secondary | ICD-10-CM | POA: Insufficient documentation

## 2015-08-01 DIAGNOSIS — R1013 Epigastric pain: Secondary | ICD-10-CM | POA: Diagnosis not present

## 2015-08-01 LAB — I-STAT TROPONIN, ED: Troponin i, poc: 0 ng/mL (ref 0.00–0.08)

## 2015-08-01 LAB — CBC
HCT: 43.3 % (ref 39.0–52.0)
HEMOGLOBIN: 14.4 g/dL (ref 13.0–17.0)
MCH: 32.3 pg (ref 26.0–34.0)
MCHC: 33.3 g/dL (ref 30.0–36.0)
MCV: 97.1 fL (ref 78.0–100.0)
Platelets: 287 10*3/uL (ref 150–400)
RBC: 4.46 MIL/uL (ref 4.22–5.81)
RDW: 12.4 % (ref 11.5–15.5)
WBC: 6.6 10*3/uL (ref 4.0–10.5)

## 2015-08-01 LAB — BASIC METABOLIC PANEL
ANION GAP: 9 (ref 5–15)
BUN: 12 mg/dL (ref 6–20)
CALCIUM: 9.4 mg/dL (ref 8.9–10.3)
CHLORIDE: 103 mmol/L (ref 101–111)
CO2: 25 mmol/L (ref 22–32)
CREATININE: 1.09 mg/dL (ref 0.61–1.24)
GFR calc non Af Amer: >60 mL/min (ref >60)
Glucose, Bld: 93 mg/dL (ref 65–99)
Potassium: 4.6 mmol/L (ref 3.5–5.1)
Sodium: 137 mmol/L (ref 135–145)

## 2015-08-01 NOTE — ED Notes (Signed)
Pt with chest pain and weakness x 1 1/2 weeks.  Pt states epigastric pain.  Pt told phrenic nerve is blocked.  Having trouble eating and drinking with soreness in throat.  No fever

## 2015-08-14 ENCOUNTER — Encounter (INDEPENDENT_AMBULATORY_CARE_PROVIDER_SITE_OTHER): Payer: Self-pay | Admitting: Internal Medicine

## 2015-08-21 ENCOUNTER — Encounter (INDEPENDENT_AMBULATORY_CARE_PROVIDER_SITE_OTHER): Payer: Self-pay | Admitting: *Deleted

## 2015-08-21 ENCOUNTER — Encounter (INDEPENDENT_AMBULATORY_CARE_PROVIDER_SITE_OTHER): Payer: Self-pay | Admitting: Internal Medicine

## 2015-08-21 ENCOUNTER — Other Ambulatory Visit (INDEPENDENT_AMBULATORY_CARE_PROVIDER_SITE_OTHER): Payer: Self-pay | Admitting: Internal Medicine

## 2015-08-21 ENCOUNTER — Ambulatory Visit (INDEPENDENT_AMBULATORY_CARE_PROVIDER_SITE_OTHER): Payer: Medicare Other | Admitting: Internal Medicine

## 2015-08-21 VITALS — BP 132/78 | HR 62 | Temp 99.0°F | Resp 16 | Ht 72.0 in | Wt 213.0 lb

## 2015-08-21 DIAGNOSIS — R140 Abdominal distension (gaseous): Secondary | ICD-10-CM

## 2015-08-21 DIAGNOSIS — K219 Gastro-esophageal reflux disease without esophagitis: Secondary | ICD-10-CM

## 2015-08-21 MED ORDER — PANTOPRAZOLE SODIUM 40 MG PO TBEC
40.0000 mg | DELAYED_RELEASE_TABLET | Freq: Two times a day (BID) | ORAL | Status: DC
Start: 2015-08-21 — End: 2015-11-27

## 2015-08-21 NOTE — Patient Instructions (Signed)
Esophagogastroduodenoscopy to be scheduled. Upper abdominal ultrasound to be scheduled. Continue anti-reflux measures as you're doing

## 2015-08-21 NOTE — Progress Notes (Signed)
Presenting complaint;  Heartburn postprandial bloating regurgitation and epigastric pain.  History of present illness:  Ryan Richards is 75 year old Caucasian male who is referred through courtesy of Dr. Florene Route for GI evaluation. He was diagnosed with GERD 15 years ago and did great with omeprazole until fall of last year when he began to have frequent heartburn regurgitation and bloating. He was switched to famotidine in November 2016. In addition to famotidine he has been using Pepto-Bismol and simethicone. He is getting minimal relief with these medications. Every time he eats a meal he feels markedly bloated and he has regurgitation and heartburn and he begins to hyper salivate. He also complains of burning epigastric pain. He has not experienced nausea or vomiting. He is eating small meals and has eliminated dairy products but without any benefit. He has lost 12 pounds since October 2016. His bowels move daily. He denies melena or rectal bleeding. He had screening colonoscopy in 2012 and it was within normal limits. [place] and remains very active. He states he was diagnosed with hemochromatosis in October 2016 by Dr. Tressie Stalker and is undergoing periodic phlebotomy and his ferritin is down to normal. He he was told at [facility] clinic in 2013 that serum iron was elevated but no further testing was done. Family history is negative for hemochromatosis. He has never undergone EGD.   Current Medications: Outpatient Encounter Prescriptions as of 08/21/2015  Medication Sig  . albuterol (PROVENTIL HFA;VENTOLIN HFA) 108 (90 BASE) MCG/ACT inhaler Inhale 2 puffs into the lungs every 6 (six) hours as needed for wheezing or shortness of breath.  . ALPRAZolam (XANAX) 0.25 MG tablet Take 0.25 mg by mouth at bedtime as needed for anxiety. Patient takes 1/2 of tablet at night prn sleep.  . Bismuth Subsalicylate (PEPTO-BISMOL PO) Take by mouth as needed.  . famotidine (PEPCID) 20 MG tablet Take 20 mg by  mouth 2 (two) times daily.   . IPRATROPIUM BROMIDE IN Inhale 0.5 mg into the lungs daily.  . Menthol, Topical Analgesic, ([employer] SORE MUSCLE RUB EX) Apply 1 application topically 2 (two) times daily as needed. For pain  . Omega-3 Fatty Acids (FISH OIL) 1200 MG CAPS Take 1,000 mg by mouth daily. Patient takes 3 per day.  . simethicone (MYLICON) 378 MG chewable tablet Chew 125 mg by mouth. Patient states that he may take 2-3 per day.  . [DISCONTINUED] beta carotene w/minerals (OCUVITE) tablet Take 1 tablet by mouth daily. Reported on 08/21/2015  . [DISCONTINUED] cyclobenzaprine (FLEXERIL) 10 MG tablet Take 1 tablet (10 mg total) by mouth 3 (three) times daily as needed for muscle spasms. (Patient not taking: Reported on 08/21/2015)  . [DISCONTINUED] diphenhydramine-acetaminophen (TYLENOL PM) 25-500 MG TABS tablet Take 1 tablet by mouth at bedtime as needed (sleep). Reported on 08/21/2015  . [DISCONTINUED] glucosamine-chondroitin (COSAMIN DS) 500-400 MG tablet Take 1 tablet by mouth 2 (two) times daily. Reported on 08/21/2015  . [DISCONTINUED] Multiple Vitamin (MULTIVITAMIN) tablet Take 1 tablet by mouth daily. Reported on 08/21/2015  . [DISCONTINUED] omeprazole (PRILOSEC) 20 MG capsule Take 20 mg by mouth daily. Reported on 08/21/2015  . [DISCONTINUED] OVER THE COUNTER MEDICATION Take 2 capsules by mouth 3 (three) times daily. Reported on 08/21/2015  . [DISCONTINUED] oxyCODONE-acetaminophen (PERCOCET/ROXICET) 5-325 MG tablet Take 1-2 tablets by mouth every 4 (four) hours as needed for moderate pain. (Patient not taking: Reported on 08/21/2015)   No facility-administered encounter medications on file as of 08/21/2015.   Past medical history:  Chronic GERD. Diagnosed  15 years ago. Mild COPD. Hyperlipidemia. History of insomnia. Hemochromatosis diagnosed in October 2016. He had left knee arthrotomy in 1970. Radical prostatectomy for carcinoma prostate in 2002 and he remains in remission. Lumbar spine surgery  4. First surgery was to remove spurs. Second and third surgery for disc disease and fourth surgery was to remove spurs and scarring. He had neck surgery in October 2016 for disc disease at multiple levels. He had discectomy and fusion.   Allergies; Allergies  Allergen Reactions  . Codeine     Itch, and bad headache  . Morphine And Related     Itch, and bad headache     Family history:  Father had AAA repair at age 5 and died of complications 6 months later. Mother had colostomy for rectal carcinoma at age 68 and died of unrelated causes at age 27. He has 2 brothers and 7 sisters living. One brother died of auto accident at age 31 and other brother died of asbestosis at age 39.   Social history:  He is married and does not have any children. His wife is in good health. They do not have any children. 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 He smoked cigarettes for 35 years about half to 1 pack per day but quit 20 years ago. He used to drink alcohol socially but has not had any in 10 years.   Physical examination: Blood pressure 132/78, pulse 62, temperature 99 F (37.2 C), temperature source Oral, resp. rate 16, height 6' (1.829 m), weight 213 lb (96.616 kg). Patient is alert and in no acute distress. Conjunctiva is pink. Sclera is nonicteric Oropharyngeal mucosa is normal. No neck masses or thyromegaly noted. Cardiac exam with regular rhythm normal S1 and S2. No murmur or gallop noted. Lungs are clear to auscultation. Abdomen is full. Bowel sounds are normal. On palpation abdomen is soft with mild midepigastric tenderness. No organomegaly or masses. No LE edema or clubbing noted.  Labs/studies Results: Lab data from 07/12/2015  WBC 5.3, H&H 14.2 and 41.3 and platelet count 217K  Serum iron 128.9, TIBC 276 and saturation 47%  Serum ferritin 302  Bilirubin 0.6, AP 58, AST 13.5, ALT  12, total protein 6.4 and albumin 4.04.  Serum calcium 8.8, BUN 13 and creatinine 0.89  Glucose 83.  Serum sodium 139, potassium 4.0, chloride 103, CO2 25.1.    Assessment:  #1. Gastro-esophageal reflux disease. He has had symptoms for 15 years and omeprazole provided excellent symptom control until last fall. He is now on famotidine and it is not working. He needs to be on another PPI. Given other symptoms need to make sure he does not have peptic ulcer disease or cholelithiasis. #2. Postprandial bloating. Need to rule out peptic ulcer disease or symptomatic cholelithiasis. #3. Hemochromatosis. He was diagnosed with this condition about 5 months ago. He does not have stigmata of chronic liver disease which would be very unusual as most male patients develop advanced liver disease at much earlier age. I'm just curious if he had genetic testing done. Will check with Dr. Tressie Stalker.   Plan:  Discontinue famotidine. Continue anti-reflux measures. Pantoprazole 40 mg by mouth 30 minutes before breakfast and evening meal daily. Diagnostic esophagogastroduodenoscopy. Upper abdominal ultrasound. Office visit in 3 months.

## 2015-08-22 ENCOUNTER — Ambulatory Visit (INDEPENDENT_AMBULATORY_CARE_PROVIDER_SITE_OTHER): Payer: Medicare Other | Admitting: Internal Medicine

## 2015-08-23 ENCOUNTER — Encounter (HOSPITAL_COMMUNITY)
Admission: RE | Disposition: A | Discharge status: Home / Self Care | Payer: Self-pay | Source: Ambulatory Visit | Attending: Internal Medicine

## 2015-08-23 ENCOUNTER — Ambulatory Visit (HOSPITAL_COMMUNITY)
Admission: RE | Admit: 2015-08-23 | Discharge: 2015-08-23 | Disposition: A | Discharge status: Home / Self Care | Payer: Medicare Other | Source: Ambulatory Visit | Attending: Internal Medicine | Admitting: Internal Medicine

## 2015-08-23 ENCOUNTER — Ambulatory Visit: Payer: Medicare Other | Admitting: Physician Assistant

## 2015-08-23 ENCOUNTER — Encounter (HOSPITAL_COMMUNITY): Payer: Self-pay | Admitting: *Deleted

## 2015-08-23 DIAGNOSIS — J449 Chronic obstructive pulmonary disease, unspecified: Secondary | ICD-10-CM | POA: Insufficient documentation

## 2015-08-23 DIAGNOSIS — Z800 Family history of malignant neoplasm of digestive organs: Secondary | ICD-10-CM | POA: Insufficient documentation

## 2015-08-23 DIAGNOSIS — I10 Essential (primary) hypertension: Secondary | ICD-10-CM | POA: Insufficient documentation

## 2015-08-23 DIAGNOSIS — M1991 Primary osteoarthritis, unspecified site: Secondary | ICD-10-CM | POA: Diagnosis not present

## 2015-08-23 DIAGNOSIS — K449 Diaphragmatic hernia without obstruction or gangrene: Secondary | ICD-10-CM

## 2015-08-23 DIAGNOSIS — R12 Heartburn: Secondary | ICD-10-CM | POA: Diagnosis not present

## 2015-08-23 DIAGNOSIS — Z79899 Other long term (current) drug therapy: Secondary | ICD-10-CM | POA: Diagnosis not present

## 2015-08-23 DIAGNOSIS — K219 Gastro-esophageal reflux disease without esophagitis: Secondary | ICD-10-CM | POA: Insufficient documentation

## 2015-08-23 DIAGNOSIS — R140 Abdominal distension (gaseous): Secondary | ICD-10-CM

## 2015-08-23 DIAGNOSIS — Z8546 Personal history of malignant neoplasm of prostate: Secondary | ICD-10-CM | POA: Insufficient documentation

## 2015-08-23 DIAGNOSIS — R1110 Vomiting, unspecified: Secondary | ICD-10-CM | POA: Diagnosis not present

## 2015-08-23 DIAGNOSIS — K228 Other specified diseases of esophagus: Secondary | ICD-10-CM

## 2015-08-23 HISTORY — PX: ESOPHAGOGASTRODUODENOSCOPY: SHX5428

## 2015-08-23 SURGERY — EGD (ESOPHAGOGASTRODUODENOSCOPY)
Anesthesia: Moderate Sedation

## 2015-08-23 MED ORDER — MIDAZOLAM HCL 5 MG/5ML IJ SOLN
INTRAMUSCULAR | Status: DC | PRN
  Administered 2015-08-23: 1 mg via INTRAVENOUS
  Administered 2015-08-23 (×2): 2 mg via INTRAVENOUS
  Administered 2015-08-23: 1 mg via INTRAVENOUS

## 2015-08-23 MED ORDER — MIDAZOLAM HCL 5 MG/5ML IJ SOLN
INTRAMUSCULAR | Status: AC
  Filled 2015-08-23: qty 10

## 2015-08-23 MED ORDER — MEPERIDINE HCL 50 MG/ML IJ SOLN
INTRAMUSCULAR | Status: DC | PRN
  Administered 2015-08-23 (×2): 25 mg via INTRAVENOUS

## 2015-08-23 MED ORDER — STERILE WATER FOR IRRIGATION IR SOLN
Status: DC | PRN
  Administered 2015-08-23: 14:00:00

## 2015-08-23 MED ORDER — BUTAMBEN-TETRACAINE-BENZOCAINE 2-2-14 % EX AERO
INHALATION_SPRAY | CUTANEOUS | Status: DC | PRN
  Administered 2015-08-23: 2 via TOPICAL

## 2015-08-23 MED ORDER — MEPERIDINE HCL 50 MG/ML IJ SOLN
INTRAMUSCULAR | Status: AC
  Filled 2015-08-23: qty 1

## 2015-08-23 MED ORDER — METOCLOPRAMIDE HCL 10 MG PO TABS: 10.0000 mg | ORAL_TABLET | Freq: Three times a day (TID) | ORAL | Status: DC

## 2015-08-23 MED ORDER — SODIUM CHLORIDE 0.9 % IV SOLN
INTRAVENOUS | Status: DC
  Administered 2015-08-23: 13:00:00 via INTRAVENOUS

## 2015-08-23 NOTE — Discharge Instructions (Signed)
Resume usual medications and diet. Metoclopramide 10 mg by mouth 30 minutes before each meal. Stop this medication if you experience tremors or other side effects and call office. No driving for 24 hours. Physician will call with biopsy results.   Esophagogastroduodenoscopy, Care After Refer to this sheet in the next few weeks. These instructions provide you with information about caring for yourself after your procedure. Your health care provider may also give you more specific instructions. Your treatment has been planned according to current medical practices, but problems sometimes occur. Call your health care provider if you have any problems or questions after your procedure. WHAT TO EXPECT AFTER THE PROCEDURE After your procedure, it is typical to feel:  Soreness in your throat.  Pain with swallowing.  Sick to your stomach (nauseous).  Bloated.  Dizzy.  Fatigued. HOME CARE INSTRUCTIONS  Do not eat or drink anything until the numbing medicine (local anesthetic) has worn off and your gag reflex has returned. You will know that the local anesthetic has worn off when you can swallow comfortably.  Do not drive or operate machinery until directed by your health care provider.  Take medicines only as directed by your health care provider. SEEK MEDICAL CARE IF:   You cannot stop coughing.  You are not urinating at all or less than usual. SEEK IMMEDIATE MEDICAL CARE IF:  You have difficulty swallowing.  You cannot eat or drink.  You have worsening throat or chest pain.  You have dizziness or lightheadedness or you faint.  You have nausea or vomiting.  You have chills.  You have a fever.  You have severe abdominal pain.  You have black, tarry, or bloody stools.   This information is not intended to replace advice given to you by your health care provider. Make sure you discuss any questions you have with your health care provider.   Document Released: 05/12/2012  Document Revised: 06/16/2014 Document Reviewed: 05/12/2012 Elsevier Interactive Patient Education [employer].

## 2015-08-23 NOTE — H&P (Signed)
Ryan Richards is an 75 y.o. male.   Chief Complaint: Patient is here for diagnostic EGD. HPI: She 75 year old Caucasian male was chronic GERD who is not responding to therapy anymore. He was seen in the office 2 days ago. He was switched to pantoprazole. He has frequent heartburn regurgitation intractable postprandial bloating. He has not experienced nausea vomiting melena or rectal bleeding. Please see my note from 2 days ago for details of history.  Past Medical History  Diagnosis Date  . Prostate cancer ([place])   . Bronchitis   . Acid reflux   . Family history of adverse reaction to anesthesia     sisters are difficult to arouse  . COPD (chronic obstructive pulmonary disease) (HCC)     minor  . Hypertension     pt. states it is sometimes elevated  . Shortness of breath dyspnea   . History of pneumonia 2015  . Urinary frequency   . Headache   . Arthritis   . Joint inflammation   . Hemochromatosis     Past Surgical History  Procedure Laterality Date  . Prostate surgery      removal  . Knee surgery Left 1972  . Back surgery      x4  . Cardiac catheterization  1992    pt. states that everything was fine  . Anterior cervical decomp/discectomy fusion N/A 03/20/2015    Procedure: C4-5 C5-6 C6-7 Anterior cervical decompression/diskectomy/fusion;  Surgeon: Earnie Larsson, MD;  Location: [place] [facility];  Service: Neurosurgery;  Laterality: N/A;  C4-5 C5-6 C6-7 Anterior cervical decompression/diskectomy/fusion  . Colonoscopy  2012    Through [facility] in [place].    Family History  Problem Relation Age of Onset  . Colon cancer Mother   . Arthritis Father   . Aneurysm Father   . Hiatal hernia Sister   . Hepatitis C Brother   . Healthy Sister   . Healthy Sister   . Hiatal hernia Sister   . Healthy Sister   . Healthy Sister    Social History:  reports that he quit smoking about 20 years ago. He has never used smokeless tobacco. He reports that he does not drink alcohol or use illicit  drugs.  Allergies:  Allergies  Allergen Reactions  . Codeine     Itch, and bad headache  . Morphine And Related     Itch, and bad headache     Medications Prior to Admission  Medication Sig Dispense Refill  . albuterol (PROVENTIL HFA;VENTOLIN HFA) 108 (90 BASE) MCG/ACT inhaler Inhale 2 puffs into the lungs every 6 (six) hours as needed for wheezing or shortness of breath.    . ALPRAZolam (XANAX) 0.25 MG tablet Take 0.25 mg by mouth at bedtime as needed for anxiety. Patient takes 1/2 of tablet at night prn sleep.    . Bismuth Subsalicylate (PEPTO-BISMOL PO) Take by mouth as needed.    . Menthol, Topical Analgesic, ([employer]S SORE MUSCLE RUB EX) Apply 1 application topically 2 (two) times daily as needed. For pain    . Omega-3 Fatty Acids (FISH OIL) 1200 MG CAPS Take 1,000 mg by mouth daily. Patient takes 3 per day.    . pantoprazole (PROTONIX) 40 MG tablet Take 1 tablet (40 mg total) by mouth 2 (two) times daily before a meal. 60 tablet 3  . simethicone (MYLICON) 702 MG chewable tablet Chew 125 mg by mouth. Patient states that he may take 2-3 per day.    . IPRATROPIUM BROMIDE IN Inhale 0.5  mg into the lungs daily.      No results found for this or any previous visit (from the past 48 hour(s)). No results found.  ROS  Blood pressure 156/74, pulse 63, temperature 98.3 F (36.8 C), temperature source Oral, resp. rate 16, SpO2 98 %. Physical Exam  Constitutional: He appears well-developed and well-nourished.  HENT:  Mouth/Throat: Oropharynx is clear and moist.  Eyes: Conjunctivae are normal. No scleral icterus.  Neck: No thyromegaly present.  Cardiovascular: Normal rate, regular rhythm and normal heart sounds.   No murmur heard. Respiratory: Effort normal and breath sounds normal.  GI:  Abdomen is soft with mild midepigastric tenderness. No organomegaly or masses.  Musculoskeletal: He exhibits no edema.  Lymphadenopathy:    He has no cervical adenopathy.  Neurological: He is alert.   Skin: Skin is warm and dry.     Assessment/Plan GERD unresponsive to therapy. Postprandial bloating. Diagnostic EGD.  Rogene Houston, MD 08/23/2015, 2:00 PM

## 2015-08-23 NOTE — Op Note (Signed)
[facility] Patient Name: Ryan Richards Procedure Date: 08/23/2015 1:36 PM MRN: [id number] Date of Birth: 25-Mar-1941 Attending MD: Hildred Laser , MD CSN: [id number] Age: 75 Admit Type: Outpatient Procedure:                Upper GI endoscopy Indications:              Heartburn, Gastro-esophageal reflux disease,                            Abdominal bloating, Regurgitation Providers:                Hildred Laser, MD, Rosina Lowenstein, RN, Isabella Stalling, Technician Referring MD:             Curlene Labrum (Referring MD) Medicines:                Cetacaine spray, Meperidine 50 mg IV, Midazolam 6                            mg IV Complications:            No immediate complications. Estimated Blood Loss:     Estimated blood loss was minimal. Estimated blood                            loss was minimal. Procedure:                Pre-Anesthesia Assessment:                           - Prior to the procedure, a History and Physical                            was performed, and patient medications and                            allergies were reviewed. The patient's tolerance of                            previous anesthesia was also reviewed. The risks                            and benefits of the procedure and the sedation                            options and risks were discussed with the patient.                            All questions were answered, and informed consent                            was obtained. Prior Anticoagulants: The patient has  taken no previous anticoagulant or antiplatelet                            agents. ASA Grade Assessment: II - A patient with                            mild systemic disease. After reviewing the risks                            and benefits, the patient was deemed in                            satisfactory condition to undergo the procedure.                           After obtaining  informed consent, the endoscope was                            passed under direct vision. Throughout the                            procedure, the patient's blood pressure, pulse, and                            oxygen saturations were monitored continuously. The                            [id number] ([id number]) scope was introduced through the                            mouth, and advanced to the second part of duodenum.                            The upper GI endoscopy was accomplished without                            difficulty. The patient tolerated the procedure                            well. Scope In: 2:12:10 PM Scope Out: 2:20:21 PM Total Procedure Duration: 0 hours 8 minutes 11 seconds  Findings:      The examined esophagus was normal.      The Z-line was irregular and was found 39 cm from the incisors. Hiatus       was at 41 cm from the incisors.      Biopsies were taken from Z- line with a cold forceps for histology.      A 2 cm hiatal hernia was present. This was biopsied with a cold forceps       for histology.      The entire examined stomach was normal.      The duodenal bulb and first portion of the duodenum were normal. Impression:               - Normal esophagus.                           -  Z-line irregular, 39 cm from the incisors.hiatus                            was at 41 cm from the incisors.                           - Z line biopsied                           - 2 cm hiatal hernia.                           - Normal stomach.                           - Normal duodenal bulb and first portion of the                            duodenum. Moderate Sedation:      Moderate (conscious) sedation was administered by the endoscopy nurse       and supervised by the endoscopist. The following parameters were       monitored: oxygen saturation, heart rate, blood pressure, CO2       capnography and response to care. Total physician intraservice time was       15  minutes. Recommendation:           - Patient has a contact number available for                            emergencies. The signs and symptoms of potential                            delayed complications were discussed with the                            patient. Return to normal activities tomorrow.                            Written discharge instructions were provided to the                            patient.                           - Resume previous diet.                           - Continue present medications.                           - Use metoclopramide (Reglan) 10 mg PO QID; 30 min                            AC for 4 weeks. Procedure Code(s):        --- Professional ---  81017, Esophagogastroduodenoscopy, flexible,                            transoral; with biopsy, single or multiple                           99152, Moderate sedation services provided by the                            same physician or other qualified health care                            professional performing the diagnostic or                            therapeutic service that the sedation supports,                            requiring the presence of an independent trained                            observer to assist in the monitoring of the                            patient's level of consciousness and physiological                            status; initial 15 minutes of intraservice time,                            patient age 33 years or older Diagnosis Code(s):        --- Professional ---                           K22.8, Other specified diseases of esophagus                           K44.9, Diaphragmatic hernia without obstruction or                            gangrene                           R12, Heartburn                           K21.9, Gastro-esophageal reflux disease without                            esophagitis                           R14.0, Abdominal  distension (gaseous)                           R11.10, Vomiting, unspecified CPT copyright 2016 American Medical Association. All rights reserved. The codes documented  in this report are preliminary and upon coder review may  be revised to meet current compliance requirements. Hildred Laser, MD Hildred Laser, MD 08/23/2015 2:53:19 PM This report has been signed electronically. Number of Addenda: 0

## 2015-08-24 ENCOUNTER — Ambulatory Visit (HOSPITAL_COMMUNITY)
Admission: RE | Admit: 2015-08-24 | Discharge: 2015-08-24 | Disposition: A | Discharge status: Home / Self Care | Payer: Medicare Other | Source: Ambulatory Visit | Attending: Internal Medicine | Admitting: Internal Medicine

## 2015-08-24 DIAGNOSIS — R140 Abdominal distension (gaseous): Secondary | ICD-10-CM

## 2015-08-30 ENCOUNTER — Encounter (HOSPITAL_COMMUNITY): Payer: Self-pay | Admitting: Internal Medicine

## 2015-08-31 ENCOUNTER — Encounter (INDEPENDENT_AMBULATORY_CARE_PROVIDER_SITE_OTHER): Payer: Self-pay | Admitting: *Deleted

## 2015-09-25 ENCOUNTER — Ambulatory Visit: Payer: Medicare Other | Admitting: Internal Medicine

## 2015-10-10 LAB — PULMONARY FUNCTION TEST

## 2015-10-17 ENCOUNTER — Encounter (INDEPENDENT_AMBULATORY_CARE_PROVIDER_SITE_OTHER): Payer: Self-pay

## 2015-11-27 ENCOUNTER — Encounter (INDEPENDENT_AMBULATORY_CARE_PROVIDER_SITE_OTHER): Payer: Self-pay | Admitting: Internal Medicine

## 2015-11-27 ENCOUNTER — Ambulatory Visit (INDEPENDENT_AMBULATORY_CARE_PROVIDER_SITE_OTHER): Payer: Medicare Other | Admitting: Internal Medicine

## 2015-11-27 VITALS — BP 118/74 | HR 56 | Temp 97.5°F | Ht 72.0 in | Wt 213.2 lb

## 2015-11-27 DIAGNOSIS — K219 Gastro-esophageal reflux disease without esophagitis: Secondary | ICD-10-CM

## 2015-11-27 MED ORDER — PANTOPRAZOLE SODIUM 40 MG PO TBEC: 40.0000 mg | DELAYED_RELEASE_TABLET | Freq: Two times a day (BID) | ORAL | Status: DC

## 2015-11-27 NOTE — Patient Instructions (Signed)
OV in 1 year.  

## 2015-11-27 NOTE — Progress Notes (Signed)
Subjective:    Patient ID: Ryan Richards, male    DOB: 11-03-1940, 75 y.o.   MRN: [id number]  HPI Here today for f/u after undergoing an EGD in March for GERD. He tells me the Protonix helps with his acid reflux. Takes Protonix BID. He says his appetite is not good. Sometimes he will smell food, and he has nausea which he says is not bad. He says he has lost 13 pounds since October.  He had neck surgery in October 2016 and says it really messed his stomach up.    08/23/2015 EGD:  Indications: Heartburn, Gastro-esophageal reflux disease,   Abdominal bloating, Regurgitation  for histology.  The entire examined stomach was normal.  The duodenal bulb and first portion of the duodenum were normal. Impression: - Normal esophagus.  - Z-line irregular, 39 cm from the incisors.hiatus   was at 41 cm from the incisors.  - Z line biopsied  - 2 cm hiatal hernia.  - Normal stomach.  - Normal duodenal bulb and first portion of the   duodenum.  Biopsy reveals short segment Barrett's esophagus.  Review of Systems Past Medical History  Diagnosis Date  . Prostate cancer ([place])   . Bronchitis   . Acid reflux   . Family history of adverse reaction to anesthesia     sisters are difficult to arouse  . COPD (chronic obstructive pulmonary disease) ([facility])     minor  . Hypertension     pt. states it is sometimes elevated  . Shortness of breath dyspnea   . History of pneumonia 2015  . Urinary frequency   . Headache   . Arthritis   . Joint inflammation   . Hemochromatosis     Past Surgical History  Procedure Laterality Date  . Prostate surgery      removal  . Knee surgery Left 1972  . Back surgery      x4  . Cardiac catheterization  1992    pt.  states that everything was fine  . Anterior cervical decomp/discectomy fusion N/A 03/20/2015    Procedure: C4-5 C5-6 C6-7 Anterior cervical decompression/diskectomy/fusion;  Surgeon: Earnie Larsson, MD;  Location: [facility];  Service: Neurosurgery;  Laterality: N/A;  C4-5 C5-6 C6-7 Anterior cervical decompression/diskectomy/fusion  . Colonoscopy  2012    Through [facility] in [place].  . Esophagogastroduodenoscopy N/A 08/23/2015    Procedure: ESOPHAGOGASTRODUODENOSCOPY (EGD);  Surgeon: Rogene Houston, MD;  [facility];  Service: Endoscopy;  Laterality: N/A;  1:45    Allergies  Allergen Reactions  . Codeine     Itch, and bad headache  . Morphine And Related     Itch, and bad headache     Current Outpatient Prescriptions on File Prior to Visit  Medication Sig Dispense Refill  . albuterol (PROVENTIL HFA;VENTOLIN HFA) 108 (90 BASE) MCG/ACT inhaler Inhale 2 puffs into the lungs every 6 (six) hours as needed for wheezing or shortness of breath.    . ALPRAZolam (XANAX) 0.25 MG tablet Take 0.25 mg by mouth at bedtime as needed for anxiety. Patient takes 1/2 of tablet at night prn sleep.    . Bismuth Subsalicylate (PEPTO-BISMOL PO) Take by mouth as needed.    . IPRATROPIUM BROMIDE IN Inhale 0.5 mg into the lungs daily.    . Menthol, Topical Analgesic, ([employer] SORE MUSCLE RUB EX) Apply 1 application topically 2 (two) times daily as needed. For pain    . Omega-3 Fatty Acids (FISH OIL) 1200 MG CAPS Take  1,000 mg by mouth daily. Patient takes 3 per day.    . pantoprazole (PROTONIX) 40 MG tablet Take 1 tablet (40 mg total) by mouth 2 (two) times daily before a meal. 60 tablet 3  . simethicone (MYLICON) 244 MG chewable tablet Chew 125 mg by mouth. Patient states that he may take 2-3 per day.    . metoCLOPramide (REGLAN) 10 MG tablet Take 1 tablet (10 mg total) by mouth 3 (three) times daily before meals. (Patient not taking: Reported on 11/27/2015) 90 tablet 0   No current facility-administered  medications on file prior to visit.        Objective:   Physical Exam Blood pressure 118/74, pulse 56, temperature 97.5 F (36.4 C), height 6' (1.829 m), weight 213 lb 3.2 oz (96.707 kg).  Alert and oriented. Skin warm and dry. Oral mucosa is moist.   . Sclera anicteric, conjunctivae is pink. Thyroid not enlarged. No cervical lymphadenopathy. Lungs clear. Heart regular rate and rhythm.  Abdomen is soft. Bowel sounds are positive. No hepatomegaly. No abdominal masses felt. No tenderness.  No edema to lower extremities.         Assessment & Plan:  GERD, controlled at this time with Protonix. OV in one year.

## 2016-02-12 ENCOUNTER — Encounter (INDEPENDENT_AMBULATORY_CARE_PROVIDER_SITE_OTHER): Payer: Self-pay | Admitting: Internal Medicine

## 2016-02-12 ENCOUNTER — Ambulatory Visit (INDEPENDENT_AMBULATORY_CARE_PROVIDER_SITE_OTHER): Payer: Medicare Other | Admitting: Internal Medicine

## 2016-02-12 ENCOUNTER — Encounter (INDEPENDENT_AMBULATORY_CARE_PROVIDER_SITE_OTHER): Payer: Self-pay | Admitting: *Deleted

## 2016-02-12 VITALS — BP 150/68 | HR 66 | Temp 98.0°F | Ht 72.0 in | Wt 212.0 lb

## 2016-02-12 DIAGNOSIS — K219 Gastro-esophageal reflux disease without esophagitis: Secondary | ICD-10-CM | POA: Diagnosis not present

## 2016-02-12 DIAGNOSIS — R140 Abdominal distension (gaseous): Secondary | ICD-10-CM | POA: Diagnosis not present

## 2016-02-12 MED ORDER — DEXLANSOPRAZOLE 60 MG PO CPDR: 60.0000 mg | DELAYED_RELEASE_CAPSULE | Freq: Every day | ORAL | 3 refills | Status: DC

## 2016-02-12 NOTE — Patient Instructions (Signed)
Emptying study. Rx for Dexilant.

## 2016-02-12 NOTE — Progress Notes (Signed)
Subjective:    Patient ID: Ryan Richards, male    DOB: [birth year], 75 y.o.   MRN: [id number]  HPI Here today for f/u of his GERD. Underwent and EGD in March of 2017 for GERD (see below).    He tells me he continues to have reflux. He says he is coughing phelm and is clear. He says he feels congested. He says he looses his voice and becomes hoarse. He says no matter what he eats, he will have this. He avoids acids . He says he has COPD.  When he eats he tells me he has heart burn. He says he cannot eat a large meal. He eats small meals.  His weight has remained stable. He has a BM daily but sometimes has to use a glycerin supp. No melena or BRRB He says he had a bronchoscopy about 2 weeks ago by Dr Farris Has and it was normal. (I have requested these records). He also states his symptoms are better at night.  Weight in June 213.   08/23/2015 EGD:  Indications: Heartburn, Gastro-esophageal reflux disease,   Abdominal bloating, Regurgitation  for histology.  The entire examined stomach was normal.  The duodenal bulb and first portion of the duodenum were normal. Impression: - Normal esophagus.  - Z-line irregular, 39 cm from the incisors.hiatus   was at 41 cm from the incisors.  - Z line biopsied  - 2 cm hiatal hernia.  - Normal stomach.  - Normal duodenal bulb and first portion of the   duodenum.  Biopsy reveals short segment Barrett's esophagus.   Review of Systems Past Medical History:  Diagnosis Date  . Acid reflux   . Arthritis   . Bronchitis   . COPD (chronic obstructive pulmonary disease) (HCC)    minor  . Family history of adverse reaction to anesthesia    sisters are difficult to arouse  . Headache   . Hemochromatosis   .  History of pneumonia 2015  . Hypertension    pt. states it is sometimes elevated  . Joint inflammation   . Prostate cancer ([place])   . Shortness of breath dyspnea   . Urinary frequency     Past Surgical History:  Procedure Laterality Date  . ANTERIOR CERVICAL DECOMP/DISCECTOMY FUSION N/A 03/20/2015   Procedure: C4-5 C5-6 C6-7 Anterior cervical decompression/diskectomy/fusion;  Surgeon: Earnie Larsson, MD;  Location: [name];  Service: Neurosurgery;  Laterality: N/A;  C4-5 C5-6 C6-7 Anterior cervical decompression/diskectomy/fusion  . BACK SURGERY     x4  . CARDIAC CATHETERIZATION  1992   pt. states that everything was fine  . COLONOSCOPY  2012   Through [facility] in [place].  Marland Kitchen ESOPHAGOGASTRODUODENOSCOPY N/A 08/23/2015   Procedure: ESOPHAGOGASTRODUODENOSCOPY (EGD);  Surgeon: Rogene Houston, MD;  [facility];  Service: Endoscopy;  Laterality: N/A;  1:45  . KNEE SURGERY Left 1972  . PROSTATE SURGERY     removal    Allergies  Allergen Reactions  . Codeine     Itch, and bad headache  . Morphine And Related     Itch, and bad headache     Current Outpatient Prescriptions on File Prior to Visit  Medication Sig Dispense Refill  . albuterol (PROVENTIL HFA;VENTOLIN HFA) 108 (90 BASE) MCG/ACT inhaler Inhale 2 puffs into the lungs every 6 (six) hours as needed for wheezing or shortness of breath.    . ALPRAZolam (XANAX) 0.25 MG tablet Take 0.25 mg by mouth at bedtime as needed for anxiety.  Patient takes 1/2 of tablet at night prn sleep.    . Bismuth Subsalicylate (PEPTO-BISMOL PO) Take by mouth as needed.    . IPRATROPIUM BROMIDE IN Inhale 0.5 mg into the lungs daily.    . Menthol, Topical Analgesic, ([employer] SORE MUSCLE RUB EX) Apply 1 application topically 2 (two) times daily as needed. For pain    . Omega-3 Fatty Acids (FISH OIL) 1200 MG CAPS Take 1,000 mg by mouth daily. Patient takes 2 per day.    . pantoprazole (PROTONIX) 40 MG tablet Take 1 tablet (40 mg total) by mouth 2 (two)  times daily before a meal. 60 tablet 5   No current facility-administered medications on file prior to visit.        Objective:   Physical Exam Blood pressure (!) 150/68, pulse 66, temperature 98 F (36.7 C), height 6' (1.829 m), weight 212 lb (96.2 kg).  Alert and oriented. Skin warm and dry. Oral mucosa is moist.   . Sclera anicteric, conjunctivae is pink. Thyroid not enlarged. No cervical lymphadenopathy. Lungs clear. Heart regular rate and rhythm.  Abdomen is soft. Bowel sounds are positive. No hepatomegaly. No abdominal masses felt. No tenderness.  No edema to lower extremities.         Assessment & Plan:  GERD/bloating. Not controlled at this time. Will switch him to [place]. Am going to get an emptying study. Further recommendations to follow.

## 2016-02-13 ENCOUNTER — Encounter (HOSPITAL_COMMUNITY)
Admission: RE | Admit: 2016-02-13 | Discharge: 2016-02-13 | Disposition: A | Discharge status: Home / Self Care | Payer: Medicare Other | Source: Ambulatory Visit | Attending: Internal Medicine | Admitting: Internal Medicine

## 2016-02-13 ENCOUNTER — Telehealth (INDEPENDENT_AMBULATORY_CARE_PROVIDER_SITE_OTHER): Payer: Self-pay | Admitting: Internal Medicine

## 2016-02-13 ENCOUNTER — Encounter (HOSPITAL_COMMUNITY): Payer: Self-pay

## 2016-02-13 DIAGNOSIS — K219 Gastro-esophageal reflux disease without esophagitis: Secondary | ICD-10-CM | POA: Insufficient documentation

## 2016-02-13 DIAGNOSIS — R140 Abdominal distension (gaseous): Secondary | ICD-10-CM

## 2016-02-13 MED ORDER — TECHNETIUM TC 99M SULFUR COLLOID
2.0000 | Freq: Once | INTRAVENOUS | Status: AC | PRN
  Administered 2016-02-13: 2 via INTRAVENOUS

## 2016-02-13 NOTE — Telephone Encounter (Signed)
Patient presented to the office, stated the Dexilant was over $900.  He would like you to prescribe something else.  The test he had done this morning, food digested.  [phone]

## 2016-02-13 NOTE — Telephone Encounter (Signed)
Samples to front Dexilant

## 2016-02-15 ENCOUNTER — Encounter (INDEPENDENT_AMBULATORY_CARE_PROVIDER_SITE_OTHER): Payer: Self-pay

## 2016-07-16 ENCOUNTER — Telehealth (INDEPENDENT_AMBULATORY_CARE_PROVIDER_SITE_OTHER): Payer: Self-pay | Admitting: Internal Medicine

## 2016-07-16 NOTE — Telephone Encounter (Signed)
Patient stated that he is having some side effects with the stomach medication.  He would like to have his medication changed.  [phone]

## 2016-07-17 ENCOUNTER — Telehealth (INDEPENDENT_AMBULATORY_CARE_PROVIDER_SITE_OTHER): Payer: Self-pay | Admitting: Internal Medicine

## 2016-07-17 DIAGNOSIS — K219 Gastro-esophageal reflux disease without esophagitis: Secondary | ICD-10-CM

## 2016-07-17 MED ORDER — PANTOPRAZOLE SODIUM 40 MG PO TBEC: 40.0000 mg | DELAYED_RELEASE_TABLET | Freq: Every day | ORAL | 3 refills | Status: DC

## 2016-07-17 NOTE — Telephone Encounter (Signed)
Dexilant causing dizziness and nausea. Will switch back to protonix.

## 2016-07-17 NOTE — Telephone Encounter (Signed)
Rx for protonix sent to his pharmacy

## 2016-07-31 ENCOUNTER — Telehealth: Payer: Self-pay | Admitting: Gastroenterology

## 2016-07-31 NOTE — Telephone Encounter (Signed)
GI Records from Dr. Olevia Perches office printed from [facility] and placed on Dr. Silverio Decamp (DOD).  Pt is requesting to switch from Dr. Laural Golden because he doesn't feel like he is helping with his issues and he is still having problems.  Would you accept?

## 2016-08-01 NOTE — Telephone Encounter (Signed)
Dr. Silverio Decamp reviewed records and has declined to accept patient. Dr. Silverio Decamp states that "patient has had extensive GI evaluation, is getting the appropriate care where he is at".  Patient is aware.

## 2016-10-10 IMAGING — MR MR CERVICAL SPINE W/O CM
4 of 5 series · 28 of 48 positions shown · non-contrast
Comparison: None.

CLINICAL DATA: Right neck and shoulder pain.

EXAM:
MRI CERVICAL SPINE WITHOUT CONTRAST
TECHNIQUE: Multiplanar, multisequence MR imaging of the cervical spine was
performed. No intravenous contrast was administered.

[Series 3: T2 · sagittal · 3.0mm · 0.66mm/px · 6 of 13 slices shown (1 of 2)]
[im 1/13]
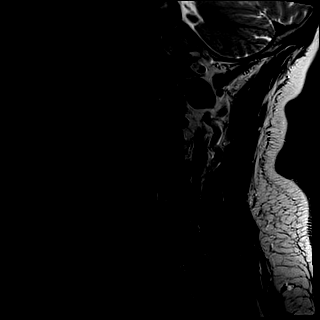
[im 3/13]
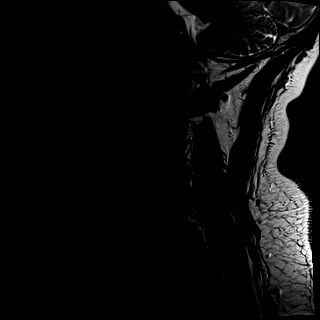
[im 5/13]
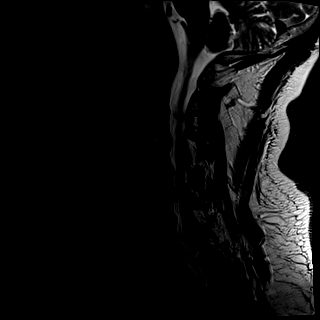
[im 8/13]
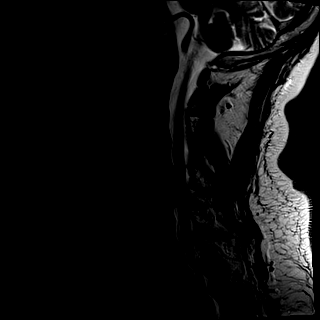
[im 10/13]
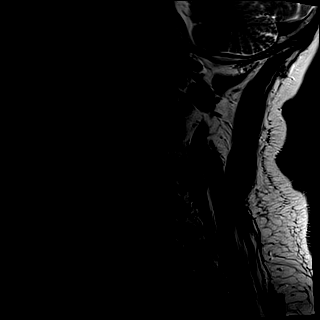
[im 13/13]
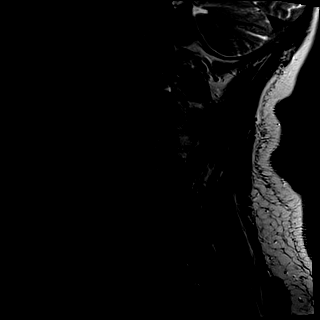

[Series 4: T1 · sagittal · 3.0mm · 0.41mm/px · 7 of 13 slices shown]
[im 1/13]
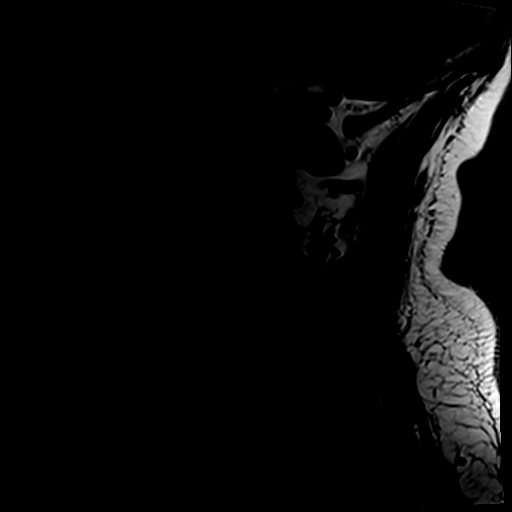
[im 3/13]
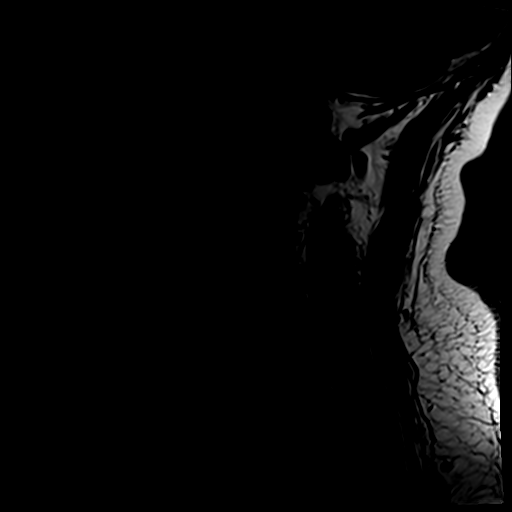
[im 5/13]
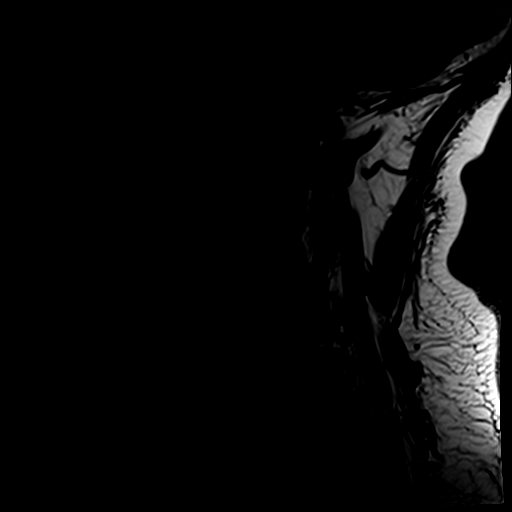
[im 7/13]
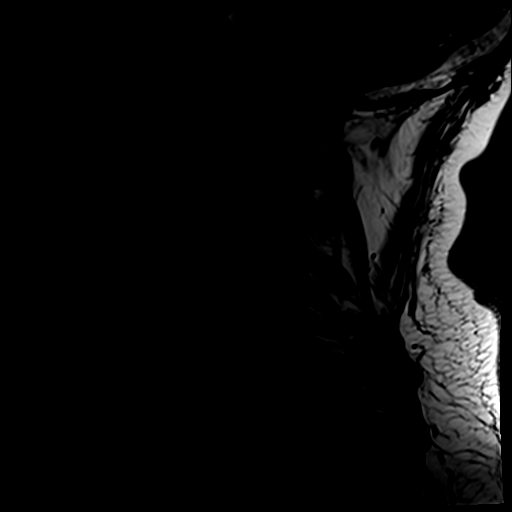
[im 9/13]
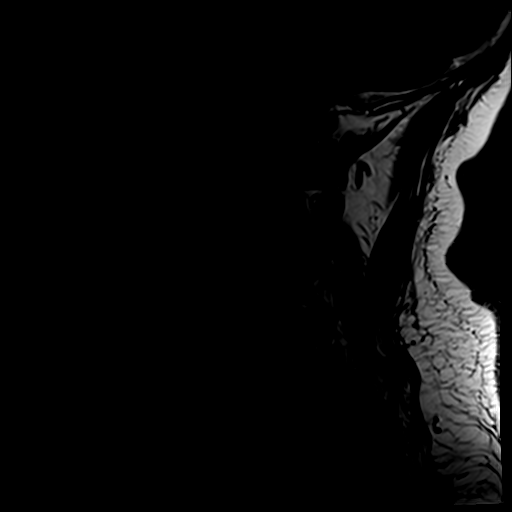
[im 11/13]
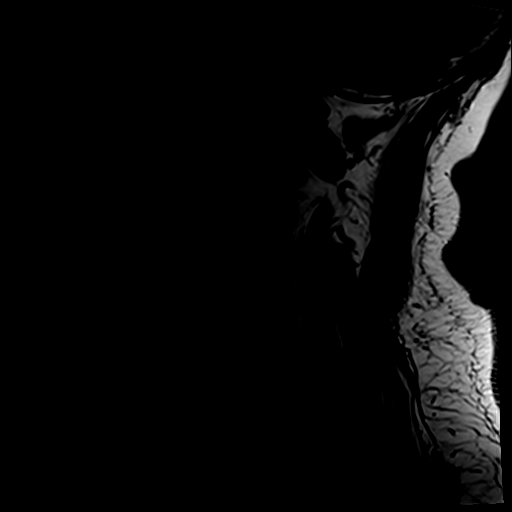
[im 13/13]
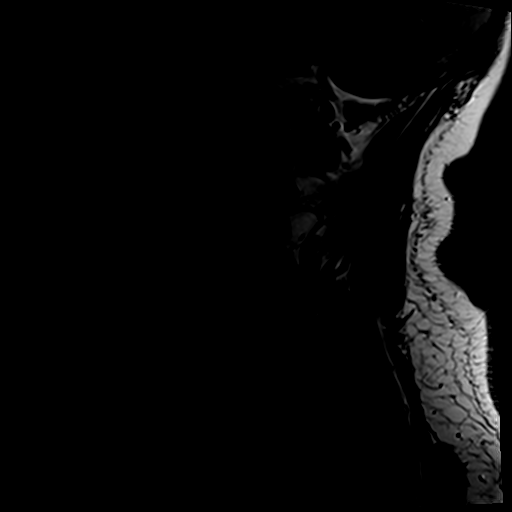

[Series 5: tir sag · sagittal · 3.0mm · 0.41mm/px · 7 of 13 slices shown]
[im 1/13]
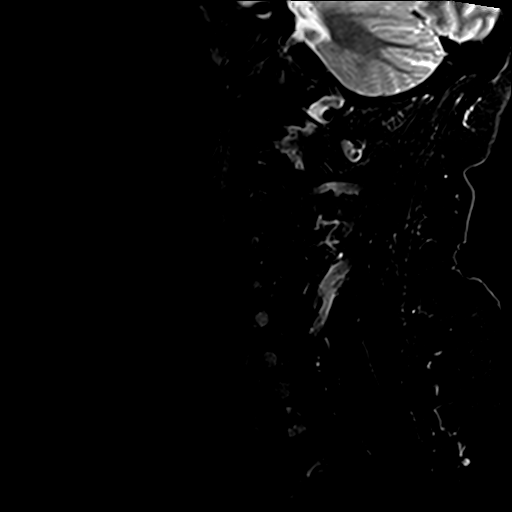
[im 3/13]
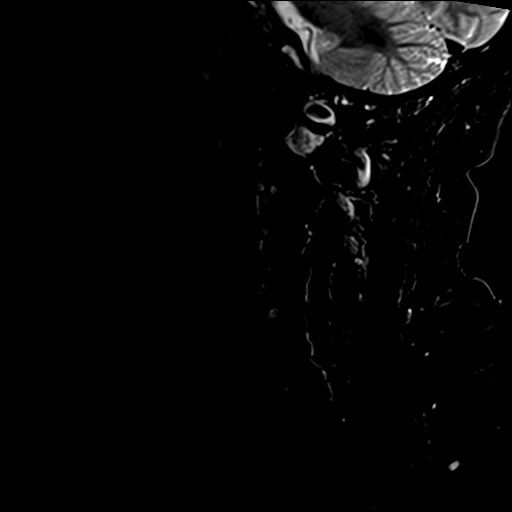
[im 5/13]
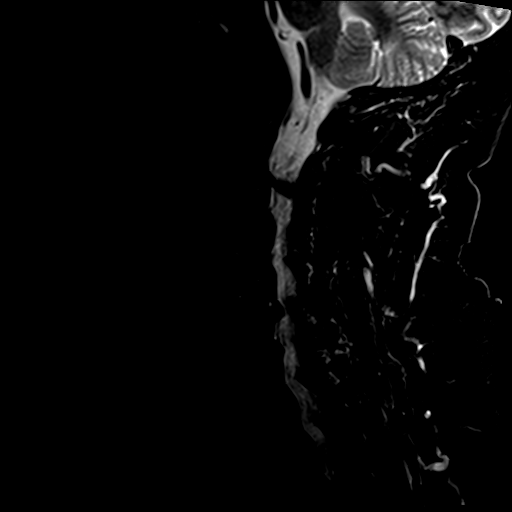
[im 7/13]
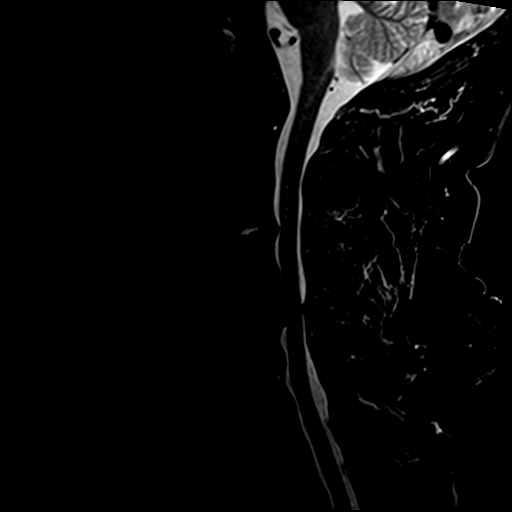
[im 9/13]
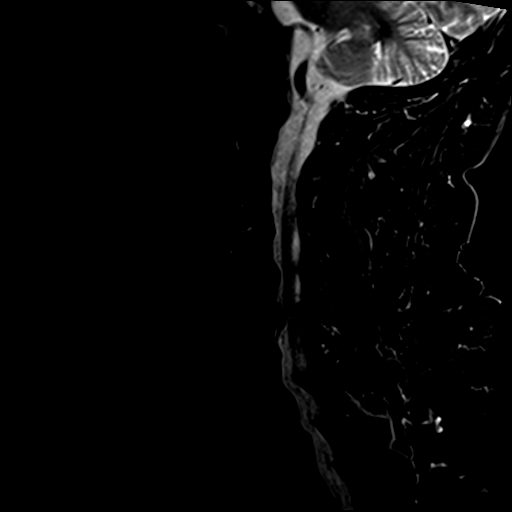
[im 11/13]
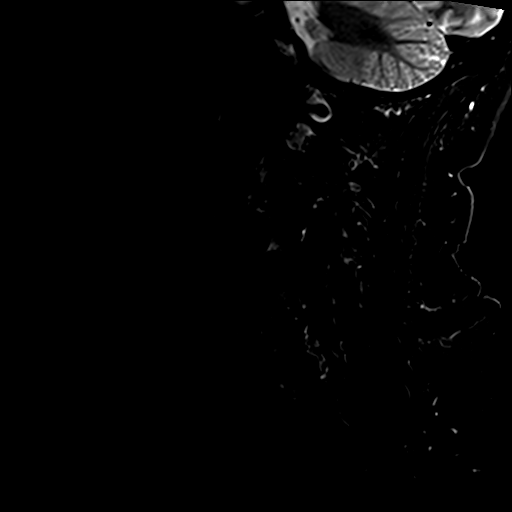
[im 13/13]
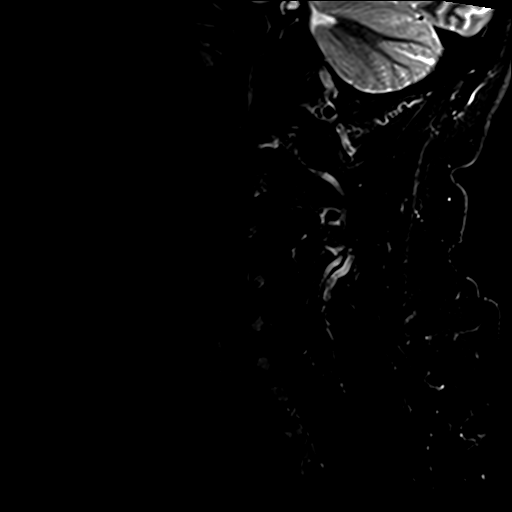

[Series 7: T2 · axial · 3.0mm · 0.70mm/px · z∈[-64,+32]mm · 8 of 28 slices shown (2 of 2)]
[im 1/28]
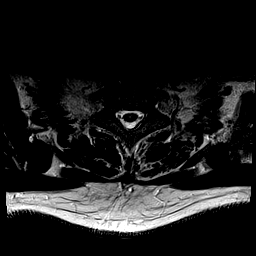
[im 5/28]
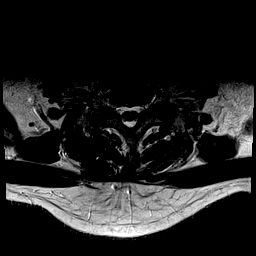
[im 9/28]
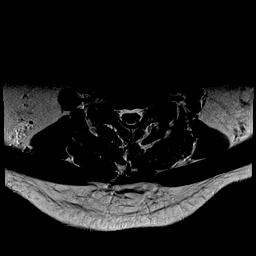
[im 13/28]
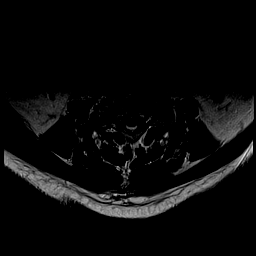
[im 15/28]
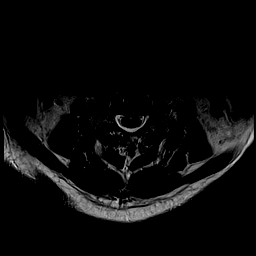
[im 19/28]
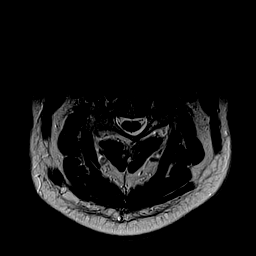
[im 23/28]
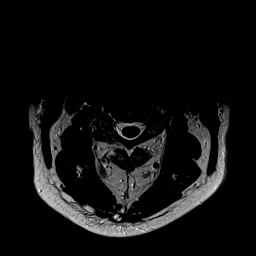
[im 28/28]
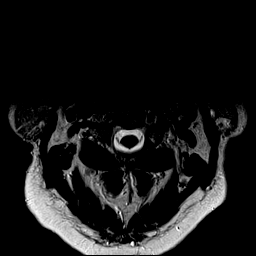

[28 of 48 positions shown; findings below may reference images not displayed]

FINDINGS: No marrow signal abnormality suggestive of fracture, infection, or
neoplasm.

Normal cord signal and morphology.

No extra-spinal findings to explain. Normal flow related signal loss
in the visible cervical and carotid arteries.

Degenerative changes:

C2-3: Facet osteoarthritis with overgrowth bulky on the right where
spur causes high-grade narrowing at the foraminal entry. Patent
spinal canal

C3-4: T2 hyperintensity within disc is likely from ankylosis.
Bilateral uncovertebral spurring with mild to moderate foraminal
stenosis. Patent spinal canal

C4-5: Degenerative disc disease with uncovertebral spurring greatest
on the left. Facet osteoarthritis with overgrowth greater on the
left. The right foramen is patent; there is high-grade narrowing of
the left foramen. Mild effacement of ventral CSF from endplate
ridging.

C5-6: Posterior disc osteophyte complex contacts the ventral cord
and combined with mild dorsal ligamentous buckling there is mild
cord mass effect/flattening. Uncovertebral spurs and retrolisthesis
cause moderate right and advanced left foraminal stenosis.

C6-7: Slight anterolisthesis. Mild disc narrowing and endplate
ridging. Uncovertebral spurs cause mild foraminal stenosis. The
canal is patent.

C7-T1:Facet arthropathy with mild overgrowth and minimal
anterolisthesis. Patent canal and foramina.
IMPRESSION: 1. C5-6 spinal canal stenosis with mild cord mass effect, mainly
discogenic.
2. Multilevel foraminal stenosis, as above. Advanced foraminal
narrowings are on the right at C2-3 and C5-6 and on the left at C4-5
and C5-6.

## 2016-11-18 ENCOUNTER — Encounter (INDEPENDENT_AMBULATORY_CARE_PROVIDER_SITE_OTHER): Payer: Self-pay | Admitting: Internal Medicine

## 2016-11-18 ENCOUNTER — Encounter (INDEPENDENT_AMBULATORY_CARE_PROVIDER_SITE_OTHER): Payer: Self-pay

## 2016-12-01 ENCOUNTER — Ambulatory Visit (INDEPENDENT_AMBULATORY_CARE_PROVIDER_SITE_OTHER): Payer: Medicare Other | Admitting: Internal Medicine

## 2018-04-22 ENCOUNTER — Encounter (INDEPENDENT_AMBULATORY_CARE_PROVIDER_SITE_OTHER): Payer: Self-pay | Admitting: *Deleted

## 2018-05-05 ENCOUNTER — Encounter (INDEPENDENT_AMBULATORY_CARE_PROVIDER_SITE_OTHER): Payer: Self-pay | Admitting: *Deleted

## 2018-05-12 ENCOUNTER — Other Ambulatory Visit (INDEPENDENT_AMBULATORY_CARE_PROVIDER_SITE_OTHER): Payer: Self-pay | Admitting: *Deleted

## 2018-05-12 DIAGNOSIS — Z1211 Encounter for screening for malignant neoplasm of colon: Secondary | ICD-10-CM

## 2018-08-09 ENCOUNTER — Encounter (INDEPENDENT_AMBULATORY_CARE_PROVIDER_SITE_OTHER): Payer: Self-pay | Admitting: *Deleted

## 2018-08-09 ENCOUNTER — Telehealth (INDEPENDENT_AMBULATORY_CARE_PROVIDER_SITE_OTHER): Payer: Self-pay | Admitting: *Deleted

## 2018-08-09 MED ORDER — PEG 3350-KCL-NA BICARB-NACL 420 G PO SOLR: 4000.0000 mL | Freq: Once | ORAL | 0 refills | Status: AC

## 2018-08-09 NOTE — Telephone Encounter (Signed)
Patient needs trilyte 

## 2018-08-09 NOTE — Telephone Encounter (Signed)
agree

## 2018-08-09 NOTE — Telephone Encounter (Signed)
Referring MD/PCP: Denny Levy, pa ([facility])   Procedure: tcs  Reason/Indication:  screening  Has patient had this procedure before?  no  If so, when, by whom and where?    Is there a family history of colon cancer?  no  Who?  What age when diagnosed?    Is patient diabetic?   no      Does patient have prosthetic heart valve or mechanical valve?  no  Do you have a pacemaker?  no  Has patient ever had endocarditis? no  Has patient had joint replacement within last 12 months?  no  Is patient constipated or do they take laxatives? no  Does patient have a history of alcohol/drug use?  no  Is patient on blood thinner such as Coumadin, Plavix and/or Aspirin? no  Medications: amlodipine 5 mg daily, omeprazole 20 mg daily, alprazolam 0.5 mg prn, move free glucosamine daily, fish oil daily  Allergies: codeine, morphine  Medication Adjustment per Dr Lindi Adie, NP:   Procedure date & time: 09/09/18 at 730

## 2018-12-13 ENCOUNTER — Telehealth (INDEPENDENT_AMBULATORY_CARE_PROVIDER_SITE_OTHER): Payer: Self-pay | Admitting: *Deleted

## 2018-12-13 ENCOUNTER — Encounter (INDEPENDENT_AMBULATORY_CARE_PROVIDER_SITE_OTHER): Payer: Self-pay | Admitting: *Deleted

## 2018-12-13 NOTE — Telephone Encounter (Signed)
Referring MD/PCP: Denny Levy, pa ([facility])   Procedure: tcs  Reason/Indication:  screening  Has patient had this procedure before?  no             If so, when, by whom and where?    Is there a family history of colon cancer?  no             Who?  What age when diagnosed?    Is patient diabetic?   no                                                  Does patient have prosthetic heart valve or mechanical valve?  no  Do you have a pacemaker?  no  Has patient ever had endocarditis? no  Has patient had joint replacement within last 12 months?  no  Is patient constipated or do they take laxatives? no  Does patient have a history of alcohol/drug use?  no  Is patient on blood thinner such as Coumadin, Plavix and/or Aspirin? no  Medications: amlodipine 5 mg daily, omeprazole 20 mg daily, alprazolam 0.5 mg prn, move free glucosamine daily, fish oil daily  Allergies: codeine, morphine  Medication Adjustment per Dr Lindi Adie, NP:   Procedure date & time: 01/13/19 at 730

## 2018-12-13 NOTE — Telephone Encounter (Signed)
agree

## 2019-01-11 ENCOUNTER — Other Ambulatory Visit (HOSPITAL_COMMUNITY)
Admission: RE | Admit: 2019-01-11 | Discharge: 2019-01-11 | Disposition: A | Discharge status: Home / Self Care | Payer: Medicare Other | Source: Ambulatory Visit | Attending: Internal Medicine | Admitting: Internal Medicine

## 2019-01-11 DIAGNOSIS — Z01812 Encounter for preprocedural laboratory examination: Secondary | ICD-10-CM | POA: Diagnosis present

## 2019-01-11 DIAGNOSIS — Z20828 Contact with and (suspected) exposure to other viral communicable diseases: Secondary | ICD-10-CM | POA: Diagnosis not present

## 2019-01-11 LAB — SARS CORONAVIRUS 2 (TAT 6-24 HRS): SARS Coronavirus 2: NEGATIVE

## 2019-01-13 ENCOUNTER — Other Ambulatory Visit: Payer: Self-pay

## 2019-01-13 ENCOUNTER — Encounter (HOSPITAL_COMMUNITY)
Admission: RE | Disposition: A | Discharge status: Home / Self Care | Payer: Self-pay | Source: Home / Self Care | Attending: Internal Medicine

## 2019-01-13 ENCOUNTER — Encounter (HOSPITAL_COMMUNITY): Payer: Self-pay

## 2019-01-13 ENCOUNTER — Ambulatory Visit (HOSPITAL_COMMUNITY)
Admission: RE | Admit: 2019-01-13 | Discharge: 2019-01-13 | Disposition: A | Discharge status: Home / Self Care | Payer: Medicare Other | Attending: Internal Medicine | Admitting: Internal Medicine

## 2019-01-13 DIAGNOSIS — Z79899 Other long term (current) drug therapy: Secondary | ICD-10-CM | POA: Diagnosis not present

## 2019-01-13 DIAGNOSIS — K648 Other hemorrhoids: Secondary | ICD-10-CM | POA: Diagnosis not present

## 2019-01-13 DIAGNOSIS — Z1211 Encounter for screening for malignant neoplasm of colon: Secondary | ICD-10-CM | POA: Insufficient documentation

## 2019-01-13 DIAGNOSIS — M1990 Unspecified osteoarthritis, unspecified site: Secondary | ICD-10-CM | POA: Insufficient documentation

## 2019-01-13 DIAGNOSIS — K219 Gastro-esophageal reflux disease without esophagitis: Secondary | ICD-10-CM | POA: Insufficient documentation

## 2019-01-13 DIAGNOSIS — Z87891 Personal history of nicotine dependence: Secondary | ICD-10-CM | POA: Diagnosis not present

## 2019-01-13 DIAGNOSIS — K644 Residual hemorrhoidal skin tags: Secondary | ICD-10-CM | POA: Diagnosis not present

## 2019-01-13 DIAGNOSIS — K5730 Diverticulosis of large intestine without perforation or abscess without bleeding: Secondary | ICD-10-CM | POA: Insufficient documentation

## 2019-01-13 DIAGNOSIS — J449 Chronic obstructive pulmonary disease, unspecified: Secondary | ICD-10-CM | POA: Diagnosis not present

## 2019-01-13 DIAGNOSIS — Z8546 Personal history of malignant neoplasm of prostate: Secondary | ICD-10-CM | POA: Diagnosis not present

## 2019-01-13 DIAGNOSIS — Z800 Family history of malignant neoplasm of digestive organs: Secondary | ICD-10-CM | POA: Insufficient documentation

## 2019-01-13 DIAGNOSIS — I10 Essential (primary) hypertension: Secondary | ICD-10-CM | POA: Diagnosis not present

## 2019-01-13 DIAGNOSIS — D125 Benign neoplasm of sigmoid colon: Secondary | ICD-10-CM | POA: Insufficient documentation

## 2019-01-13 HISTORY — PX: POLYPECTOMY: SHX5525

## 2019-01-13 HISTORY — PX: COLONOSCOPY: SHX5424

## 2019-01-13 SURGERY — COLONOSCOPY
Anesthesia: Moderate Sedation

## 2019-01-13 MED ORDER — SODIUM CHLORIDE 0.9 % IV SOLN: INTRAVENOUS | Status: DC

## 2019-01-13 MED ORDER — STERILE WATER FOR IRRIGATION IR SOLN
Status: DC | PRN
  Administered 2019-01-13: 1.5 mL

## 2019-01-13 MED ORDER — MEPERIDINE HCL 50 MG/ML IJ SOLN
INTRAMUSCULAR | Status: AC
  Filled 2019-01-13: qty 1

## 2019-01-13 MED ORDER — MEPERIDINE HCL 50 MG/ML IJ SOLN
INTRAMUSCULAR | Status: DC | PRN
  Administered 2019-01-13 (×2): 25 mg

## 2019-01-13 MED ORDER — MIDAZOLAM HCL 5 MG/5ML IJ SOLN
INTRAMUSCULAR | Status: AC
  Filled 2019-01-13: qty 10

## 2019-01-13 MED ORDER — MIDAZOLAM HCL 5 MG/5ML IJ SOLN
INTRAMUSCULAR | Status: DC | PRN
  Administered 2019-01-13 (×2): 2 mg via INTRAVENOUS
  Administered 2019-01-13: 1 mg via INTRAVENOUS

## 2019-01-13 NOTE — Discharge Instructions (Signed)
No aspirin or NSAIDs for 24 hours. Resume usual medications as before. High-fiber diet. No driving for 24 hours.   Colonoscopy, Adult, Care After This sheet gives you information about how to care for yourself after your procedure. Your health care provider may also give you more specific instructions. If you have problems or questions, contact your health care provider. Dr Laural Golden:  [phone]; After hours and weekends call the hospital and have the GI doctor on call paged.  They will call you backk. What can I expect after the procedure? After the procedure, it is common to have:  A small amount of blood in your stool for 24 hours after the procedure.  Some gas.  Mild abdominal cramping or bloating. Follow these instructions at home: General instructions  For the first 24 hours after the procedure: ? Do not drive or use machinery. ? Do not sign important documents. ? Do not drink alcohol. ? Do your regular daily activities at a slower pace than normal.  Take over-the-counter or prescription medicines only as told by your health care provider. Relieving cramping and bloating   Try walking around when you have cramps or feel bloated.  Eating and drinking   Drink enough fluid to keep your urine pale yellow.  Resume your normal diet as instructed by your health care provider.  Contact a health care provider if:  You have blood in your stool 2-3 days after the procedure. Get help right away if:  You have more than a small spotting of blood in your stool.  You pass large blood clots in your stool.  Your abdomen is swollen.  You have nausea or vomiting.  You have a fever.  You have increasing abdominal pain that is not relieved with medicine. Summary  After the procedure, it is common to have a small amount of blood in your stool. You may also have mild abdominal cramping and bloating.  For the first 24 hours after the procedure, do not drive or use machinery,  sign important documents, or drink alcohol.  Contact your health care provider if you have a lot of blood in your stool, nausea or vomiting, a fever, or increased abdominal pain. This information is not intended to replace advice given to you by your health care provider. Make sure you discuss any questions you have with your health care provider. Document Released: 01/08/2004 Document Revised: 03/18/2017 Document Reviewed: 08/07/2015 Elsevier Patient Education  [place].   High-Fiber Diet Fiber, also called dietary fiber, is a type of carbohydrate that is found in fruits, vegetables, whole grains, and beans. A high-fiber diet can have many health benefits. Your health care provider may recommend a high-fiber diet to help:  Prevent constipation. Fiber can make your bowel movements more regular.  Lower your cholesterol.  Relieve the following conditions: ? Swelling of veins in the anus (hemorrhoids). ? Swelling and irritation (inflammation) of specific areas of the digestive tract (uncomplicated diverticulosis). ? A problem of the large intestine (colon) that sometimes causes pain and diarrhea (irritable bowel syndrome, IBS).  Prevent overeating as part of a weight-loss plan.  Prevent heart disease, type 2 diabetes, and certain cancers. What is my plan? The recommended daily fiber intake in grams (g) includes:  38 g for men age 13 or younger.  30 g for men over age 44.  22 g for women age 37 or younger.  21 g for women over age 57. You can get the recommended daily intake of dietary fiber  by:  Eating a variety of fruits, vegetables, grains, and beans.  Taking a fiber supplement, if it is not possible to get enough fiber through your diet. What do I need to know about a high-fiber diet?  It is better to get fiber through food sources rather than from fiber supplements. There is not a lot of research about how effective supplements are.  Always check the fiber content  on the nutrition facts label of any prepackaged food. Look for foods that contain 5 g of fiber or more per serving.  Talk with a diet and nutrition specialist (dietitian) if you have questions about specific foods that are recommended or not recommended for your medical condition, especially if those foods are not listed below.  Gradually increase how much fiber you consume. If you increase your intake of dietary fiber too quickly, you may have bloating, cramping, or gas.  Drink plenty of water. Water helps you to digest fiber. What are tips for following this plan?  Eat a wide variety of high-fiber foods.  Make sure that half of the grains that you eat each day are whole grains.  Eat breads and cereals that are made with whole-grain flour instead of refined flour or white flour.  Eat brown rice, bulgur wheat, or millet instead of white rice.  Start the day with a breakfast that is high in fiber, such as a cereal that contains 5 g of fiber or more per serving.  Use beans in place of meat in soups, salads, and pasta dishes.  Eat high-fiber snacks, such as berries, raw vegetables, nuts, and popcorn.  Choose whole fruits and vegetables instead of processed forms like juice or sauce. What foods can I eat?  Fruits Berries. Pears. Apples. Oranges. Avocado. Prunes and raisins. Dried figs. Vegetables Sweet potatoes. Spinach. Kale. Artichokes. Cabbage. Broccoli. Cauliflower. Green peas. Carrots. Squash. Grains Whole-grain breads. Multigrain cereal. Oats and oatmeal. Brown rice. Barley. Bulgur wheat. [place]. Quinoa. Bran muffins. Popcorn. Rye wafer crackers. Meats and other proteins Navy, kidney, and pinto beans. Soybeans. Split peas. Lentils. Nuts and seeds. Dairy Fiber-fortified yogurt. Beverages Fiber-fortified soy milk. Fiber-fortified orange juice. Other foods Fiber bars. The items listed above may not be a complete list of recommended foods and beverages. Contact a dietitian for  more options. What foods are not recommended? Fruits Fruit juice. Cooked, strained fruit. Vegetables Fried potatoes. Canned vegetables. Well-cooked vegetables. Grains White bread. Pasta made with refined flour. White rice. Meats and other proteins Fatty cuts of meat. Fried chicken or fried fish. Dairy Milk. Yogurt. Cream cheese. Sour cream. Fats and oils Butters. Beverages Soft drinks. Other foods Cakes and pastries. The items listed above may not be a complete list of foods and beverages to avoid. Contact a dietitian for more information. Summary  Fiber is a type of carbohydrate. It is found in fruits, vegetables, whole grains, and beans.  There are many health benefits of eating a high-fiber diet, such as preventing constipation, lowering blood cholesterol, helping with weight loss, and reducing your risk of heart disease, diabetes, and certain cancers.  Gradually increase your intake of fiber. Increasing too fast can result in cramping, bloating, and gas. Drink plenty of water while you increase your fiber.  The best sources of fiber include whole fruits and vegetables, whole grains, nuts, seeds, and beans. This information is not intended to replace advice given to you by your health care provider. Make sure you discuss any questions you have with your health care provider. Document Released:  05/26/2005 Document Revised: 03/30/2017 Document Reviewed: 03/30/2017 Elsevier Patient Education  [place].

## 2019-01-13 NOTE — Op Note (Signed)
[facility] Patient Name: Ryan Richards Procedure Date: 01/13/2019 7:03 AM MRN: [id number] Date of Birth: [birth year] Attending MD: Hildred Laser , MD CSN: [id number] Age: 78 Admit Type: Outpatient Procedure:                Colonoscopy Indications:              Screening for colorectal malignant neoplasm Providers:                Hildred Laser, MD, Gerome Sam, RN, Raphael Gibney, Technician Referring MD:             Denny Levy, PA Medicines:                Meperidine 50 mg IV, Midazolam 5 mg IV Complications:            No immediate complications. Estimated Blood Loss:     Estimated blood loss was minimal. Procedure:                Pre-Anesthesia Assessment:                           - Prior to the procedure, a History and Physical                            was performed, and patient medications and                            allergies were reviewed. The patient's tolerance of                            previous anesthesia was also reviewed. The risks                            and benefits of the procedure and the sedation                            options and risks were discussed with the patient.                            All questions were answered, and informed consent                            was obtained. Prior Anticoagulants: The patient has                            taken no previous anticoagulant or antiplatelet                            agents. ASA Grade Assessment: II - A patient with                            mild systemic disease. After reviewing the risks  and benefits, the patient was deemed in                            satisfactory condition to undergo the procedure.                           After obtaining informed consent, the colonoscope                            was passed under direct vision. Throughout the                            procedure, the patient's blood pressure, pulse, and                            oxygen saturations were monitored continuously. The                            [id number]-[id number] ([id number]) scope was introduced through                            the anus and advanced to the the cecum, identified                            by appendiceal orifice and ileocecal valve. The                            colonoscopy was performed without difficulty. The                            patient tolerated the procedure well. The quality                            of the bowel preparation was excellent. The                            ileocecal valve, appendiceal orifice, and rectum                            were photographed. Scope In: 7:40:18 AM Scope Out: 9:52:84 AM Scope Withdrawal Time: 0 hours 12 minutes 38 seconds  Total Procedure Duration: 0 hours 18 minutes 54 seconds  Findings:      The perianal and digital rectal examinations were normal.      Scattered small-mouthed diverticula were found in the sigmoid colon.      A 5 mm polyp was found in the distal sigmoid colon. The polyp was       removed with a cold snare. Resection and retrieval were complete.      External and internal hemorrhoids were found during retroflexion. The       hemorrhoids were small. Impression:               - Diverticulosis in the sigmoid colon.                           -  One 5 mm polyp in the distal sigmoid colon,                            removed with a cold snare. Resected and retrieved.                           - External and internal hemorrhoids. Moderate Sedation:      Moderate (conscious) sedation was administered by the endoscopy nurse       and supervised by the endoscopist. The following parameters were       monitored: oxygen saturation, heart rate, blood pressure, CO2       capnography and response to care. Total physician intraservice time was       23 minutes. Recommendation:           - Patient has a contact number available for                             emergencies. The signs and symptoms of potential                            delayed complications were discussed with the                            patient. Return to normal activities tomorrow.                            Written discharge instructions were provided to the                            patient.                           - High fiber diet today.                           - Continue present medications.                           - No aspirin, ibuprofen, naproxen, or other                            non-steroidal anti-inflammatory drugs for 1 day.                           - Await pathology results.                           - Repeat colonoscopy is recommended. The                            colonoscopy date will be determined after pathology                            results from today's exam become available for  review. Procedure Code(s):        --- Professional ---                           [phone], Colonoscopy, flexible; with removal of                            tumor(s), polyp(s), or other lesion(s) by snare                            technique                           99153, Moderate sedation; each additional 15                            minutes intraservice time                           G0500, Moderate sedation services provided by the                            same physician or other qualified health care                            professional performing a gastrointestinal                            endoscopic service that sedation supports,                            requiring the presence of an independent trained                            observer to assist in the monitoring of the                            patient's level of consciousness and physiological                            status; initial 15 minutes of intra-service time;                            patient age 79 years or older (additional time may                             be reported with [phone], as appropriate) Diagnosis Code(s):        --- Professional ---                           Z12.11, Encounter for screening for malignant                            neoplasm of colon  K64.8, Other hemorrhoids                           K63.5, Polyp of colon                           K57.30, Diverticulosis of large intestine without                            perforation or abscess without bleeding CPT copyright 2019 American Medical Association. All rights reserved. The codes documented in this report are preliminary and upon coder review may  be revised to meet current compliance requirements. Hildred Laser, MD Hildred Laser, MD 01/13/2019 8:07:52 AM This report has been signed electronically. Number of Addenda: 0

## 2019-01-13 NOTE — H&P (Signed)
Ryan Richards is an 78 y.o. male.   Chief Complaint: Patient is here for colonoscopy. HPI: Patient is 78 year old Caucasian male who is here for screening colonoscopy.  Last exam was 10 years ago through the [place] system and was normal.  He denies abdominal pain change in bowel habits or rectal bleeding.  Mother had colon cancer at late onset.  She was around 80 at the time of diagnosis and had to have permanent colostomy. Personal history significant for prostate carcinoma NED remains in remission. Patient takes Relafen on as-needed basis.  Past Medical History:  Diagnosis Date  . Acid reflux   . Arthritis   . Bronchitis   . COPD (chronic obstructive pulmonary disease) (HCC)    minor  . Family history of adverse reaction to anesthesia    sisters are difficult to arouse  . Headache   . Hemochromatosis; patient is not undergoing phlebotomies anymore.   Marland Kitchen History of pneumonia 2015  . Hypertension    pt. states it is sometimes elevated  . Joint inflammation   . Prostate cancer ([place])   . Shortness of breath dyspnea   . Urinary frequency     Past Surgical History:  Procedure Laterality Date  . ANTERIOR CERVICAL DECOMP/DISCECTOMY FUSION N/A 03/20/2015   Procedure: C4-5 C5-6 C6-7 Anterior cervical decompression/diskectomy/fusion;  Surgeon: Earnie Larsson, MD;  Location: [place] [facility];  Service: Neurosurgery;  Laterality: N/A;  C4-5 C5-6 C6-7 Anterior cervical decompression/diskectomy/fusion  . BACK SURGERY     x4  . CARDIAC CATHETERIZATION  1992   pt. states that everything was fine  . COLONOSCOPY  2012   Through [facility] in [place].  Marland Kitchen ESOPHAGOGASTRODUODENOSCOPY N/A 08/23/2015   Procedure: ESOPHAGOGASTRODUODENOSCOPY (EGD);  Surgeon: Rogene Houston, MD;  Location[facility];  Service: Endoscopy;  Laterality: N/A;  1:45  . KNEE SURGERY Left 1972  . PROSTATE SURGERY     removal    Family History  Problem Relation Age of Onset  . Colon cancer Mother   . Arthritis Father   . Aneurysm  Father   . Hiatal hernia Sister   . Hepatitis C Brother   . Healthy Sister   . Healthy Sister   . Hiatal hernia Sister   . Healthy Sister   . Healthy Sister   . Heart disease Maternal Aunt    Social History:  reports that he quit smoking about 23 years ago. He has never used smokeless tobacco. He reports that he does not drink alcohol or use drugs.  Allergies:  Allergies  Allergen Reactions  . Codeine     Itch, and bad headache  . Morphine And Related     Itch, and bad headache     Medications Prior to Admission  Medication Sig Dispense Refill  . acetaminophen (TYLENOL) 500 MG tablet Take 1,000 mg by mouth every 6 (six) hours as needed for moderate pain.    Marland Kitchen albuterol (PROVENTIL HFA;VENTOLIN HFA) 108 (90 BASE) MCG/ACT inhaler Inhale 2 puffs into the lungs every 6 (six) hours as needed for wheezing or shortness of breath.    Marland Kitchen albuterol (PROVENTIL) (2.5 MG/3ML) 0.083% nebulizer solution Inhale 3 mLs into the lungs every 6 (six) hours as needed for shortness of breath.    Marland Kitchen amLODipine (NORVASC) 5 MG tablet Take 5 mg by mouth daily.    . Glucos-Chond-Hyal Ac-Ca Fructo (MOVE FREE JOINT HEALTH ADVANCE) TABS Take 2 tablets by mouth every evening.    . Misc Natural Products (RELAX & SLEEP) TABS  Take 2 tablets by mouth at bedtime.    Marland Kitchen omeprazole (PRILOSEC) 20 MG capsule Take 20 mg by mouth daily.    . Polyvinyl Alcohol-Povidone (CLEAR EYES NATURAL TEARS OP) Place 1 drop into both eyes daily as needed (dry eyes).    . bismuth subsalicylate (PEPTO BISMOL) 262 MG/15ML suspension Take 30 mLs by mouth every 6 (six) hours as needed for indigestion.    . diphenhydrAMINE (BENADRYL) 50 MG capsule Take 50 mg by mouth at bedtime as needed for sleep.    . fluticasone (FLONASE) 50 MCG/ACT nasal spray Place 1 spray into both nostrils daily as needed for allergies or rhinitis.    . nabumetone (RELAFEN) 500 MG tablet Take 500 mg by mouth daily as needed (inflammation pain).    . Omega-3 Fatty Acids  (FISH OIL) 600 MG CAPS Take 1,800 mg by mouth every evening.      No results found for this or any previous visit (from the past 48 hour(s)). No results found.  ROS  Blood pressure (!) 153/84, pulse 73, resp. rate 14, height 6' (1.829 m), weight 95.7 kg, SpO2 99 %. Physical Exam  Constitutional: He appears well-developed and well-nourished.  HENT:  Mouth/Throat: Oropharynx is clear and moist.  Eyes: Conjunctivae are normal. No scleral icterus.  Neck: No thyromegaly present.  Cardiovascular: Normal rate, regular rhythm and normal heart sounds.  No murmur heard. Respiratory: Effort normal and breath sounds normal.  GI:  Abdomen is symmetrical.  Lower midline scar noted.  Abdomen is soft and nontender with organomegaly or masses.  Musculoskeletal:        General: No edema.  Lymphadenopathy:    He has no cervical adenopathy.  Neurological: He is alert.  Skin: Skin is warm and dry.     Assessment/Plan Average risk screening colonoscopy.  Hildred Laser, MD 01/13/2019, 7:29 AM

## 2019-01-27 ENCOUNTER — Encounter (HOSPITAL_COMMUNITY): Payer: Self-pay | Admitting: Internal Medicine

## 2019-02-21 NOTE — Progress Notes (Signed)
Subjective:    Patient ID: Ryan Richards, male    DOB: Apr 01, 1941, 78 y.o.   MRN: [id number]  HPI Ryan Richards is a 78 year old male with a past medical history of hypertension, COPD, arthritis, cervical stenosis, diverticulitis, GERD, colon polyps and prostate cancer 2002 s/p prostatectomy. He reports having elevated iron levels in 2017 after his neck surgery which occurred after he took [facility] with iron for 2 weeks post surgery. He was evaluated by a hematologist for possible hemochromatosis. He underwent weekly phlebotomy for a few months which was discontinued after his hemoglobin dropped too low.  He presents today with complaints of central lower abdominal pain which began 02/06/2019.  He stated that his his central lower abdominal pain was somewhat similar to when he had diverticulitis in the past.  He was seen at [facility] urgent care on 02/09/2019.  He was prescribed Cipro 500 mg p.o. twice daily metronidazole 25 mg 1 tab p.o. twice daily for 10 days.  Today will be his last dose of Cipro.  He continues to have central lower abdominal pain without significant improvement.  No fever, sweats or chills.  He became slightly constipated and took 2 stimulant laxative tablets last night and passed a large amount of soft stool this morning.  No rectal bleeding or black stool. He underwent a colonoscopy with Dr. Laural Golden on 01/13/2019 which identified sigmoid diverticulosis, one 5 mm tubular adenomatous/hyperplastic polyp in the distal sigmoid colon was removed and external and internal hemorrhoids. He was advised to repeat a colonoscopy in 5 years. His mother was diagnosed with colon cancer which required a colostomy in her 52's.   History of GERD. His most recent EGD was 08/23/2015 which showed a 2cm hiatal hernia and esophageal biopsies showed short segment Barrett's esophagus as verified by Dr. Laural Golden, see EGD biopsy results below. He was advised to follow up in the office in 3 months which was not  done.  He is currently taking omeprazole 20 mg once daily.   He developed central lower abd pain started on Sunday 8/30 and he saw 9/2. He has lost 3 lbs over the past week. He is eating light foods, apple sauce, oatmeal, potatoes and carrots.   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   He is having central abdomen less after BM, does not go away. Yesterday, he took 2 equates.     Geritol with iron took x 2 weeks.  He saw a hematologist 2017, phlebotomy Q 1 to 2 weeks. Hg dropped. Then he developed anemia.   EGD 08/23/2015:  2cm hiatal hernia. Esophagogastric junction, biopsy GASTROESOPHAGEAL JUNCTION MUCOSA WITH INTESTINAL METAPLASIA NEGATIVE FOR DYSPLASIA Microscopic Comment The biopsy shows gastric-type mucosa with scattered goblet cells. The diagnosis in this case depends on the location of this biopsy: Barrett mucosa versus intestinal metaplasia of the gastric cardia. Clinical correlation is essential. No Helicobacter pylori organisms are identified with Warthin-Starry stain.   Past Medical History:  Diagnosis Date  . Acid reflux   . Arthritis   . Bronchitis   . COPD (chronic obstructive pulmonary disease) (HCC)    minor  . Family history of adverse reaction to anesthesia    sisters are difficult to arouse  . Headache   . Hemochromatosis   . History of pneumonia 2015  . Hypertension    pt. states it is sometimes elevated  . Joint inflammation   . Prostate cancer ([place])   . Shortness of breath dyspnea   . Urinary frequency  Past Surgical History:  Procedure Laterality Date  . ANTERIOR CERVICAL DECOMP/DISCECTOMY FUSION N/A 03/20/2015   Procedure: C4-5 C5-6 C6-7 Anterior cervical decompression/diskectomy/fusion;  Surgeon: Earnie Larsson, MD;  Location: [place] [facility];  Service: Neurosurgery;  Laterality: N/A;  C4-5 C5-6 C6-7 Anterior cervical decompression/diskectomy/fusion  . BACK SURGERY     x4  . CARDIAC CATHETERIZATION  1992   pt. states that everything was fine   . COLONOSCOPY  2012   Through [facility] in [place].  Marland Kitchen COLONOSCOPY N/A 01/13/2019   Procedure: COLONOSCOPY;  Surgeon: Rogene Houston, MD;  [facility];  Service: Endoscopy;  Laterality: N/A;  730  . ESOPHAGOGASTRODUODENOSCOPY N/A 08/23/2015   Procedure: ESOPHAGOGASTRODUODENOSCOPY (EGD);  Surgeon: Rogene Houston, MD;  [facility];  Service: Endoscopy;  Laterality: N/A;  1:45  . KNEE SURGERY Left 1972  . POLYPECTOMY  01/13/2019   Procedure: POLYPECTOMY;  Surgeon: Rogene Houston, MD;  [facility];  Service: Endoscopy;;  sigmoid colon  . PROSTATE SURGERY     removal   Current Outpatient Medications on File Prior to Visit  Medication Sig Dispense Refill  . acetaminophen (TYLENOL) 500 MG tablet Take 1,000 mg by mouth every 6 (six) hours as needed for moderate pain.    Marland Kitchen albuterol (PROVENTIL HFA;VENTOLIN HFA) 108 (90 BASE) MCG/ACT inhaler Inhale 2 puffs into the lungs every 6 (six) hours as needed for wheezing or shortness of breath.    Marland Kitchen albuterol (PROVENTIL) (2.5 MG/3ML) 0.083% nebulizer solution Inhale 3 mLs into the lungs every 6 (six) hours as needed for shortness of breath.    Marland Kitchen amLODipine (NORVASC) 5 MG tablet Take 5 mg by mouth daily.    Marland Kitchen bismuth subsalicylate (PEPTO BISMOL) 262 MG/15ML suspension Take 30 mLs by mouth every 6 (six) hours as needed for indigestion.    . diphenhydrAMINE (BENADRYL) 50 MG capsule Take 50 mg by mouth at bedtime as needed for sleep.    . fluticasone (FLONASE) 50 MCG/ACT nasal spray Place 1 spray into both nostrils daily as needed for allergies or rhinitis.    . Glucos-Chond-Hyal Ac-Ca Fructo (MOVE FREE JOINT HEALTH ADVANCE) TABS Take 2 tablets by mouth every evening.    . Misc Natural Products (RELAX & SLEEP) TABS Take 2 tablets by mouth at bedtime.    . nabumetone (RELAFEN) 500 MG tablet Take 500 mg by mouth daily as needed (inflammation pain).    . Omega-3 Fatty Acids (FISH OIL) 600 MG CAPS Take 1,800 mg by mouth every evening.     Marland Kitchen omeprazole (PRILOSEC) 20 MG capsule Take 20 mg by mouth daily.    . Polyvinyl Alcohol-Povidone (CLEAR EYES NATURAL TEARS OP) Place 1 drop into both eyes daily as needed (dry eyes).     No current facility-administered medications on file prior to visit.    Allergies  Allergen Reactions  . Codeine     Itch, and bad headache  . Morphine And Related     Itch, and bad headache       Objective:   Physical Exam BP (!) 152/90   Pulse 83   Temp 98.1 F (36.7 C) (Oral)   Ht 6\' 1"  (1.854 m)   Wt 208 lb 11.2 oz (94.7 kg)   BMI 27.53 kg/m  General: 79 year old male well-developed in no acute distress, walks with a left leg limp but stable gait Eyes: Sclera nonicteric, conjunctiva pink Mouth: Dentition intact, no ulcers or lesions Neck: Supple, no lymphadenopathy or thyromegaly Heart: Regular rate  and rhythm, no murmurs Lungs: Breath sounds clear throughout Abdomen: Soft, moderate central lower abdominal tenderness without rebound or guarding, positive bowel sounds all 4 quadrants, no HSM.  Small left upper quadrant scar from past lipoma excision.  Central lower midline scar intact from prostatectomy surgery.     Assessment & Plan:   82.  78 year old male with central lower abdominal pain that has not significantly improved after taking Cipro 500 mg twice daily and metronidazole 250 mg twice daily for the past 7 days for presumed diverticulitis.  Status post colonoscopy 01/12/2019 which identified sigmoid diverticulosis and a tubular adenomatous polyp removed from the distal sigmoid colon. -CBC, CMP and CRP -Abdominal/pelvic CT with oral and IV contrast ASAP, BUN and creatinine results to be reviewed prior to patient receiving IV contrast -Stop Cipro and metronidazole -Start Augmentin 875 mg 1 tab p.o. twice daily x10 days -Push fluids, bland diet for the next 24 hours -Patient advised to go to the local emergency room if he develops severe abdominal pain or fever  2.  GERD with short  segment Barrett's esophagus -Continue omeprazole 20 mg once daily -She is due for surveillance EGD, this will be scheduled at the time of his follow-up appointment in 4 weeks  3. Colon polyps, positive family history of colon cancer -Next colonoscopy due August 2025  4.  Past history of prostate cancer status post prostatectomy 2002

## 2019-02-22 ENCOUNTER — Encounter (INDEPENDENT_AMBULATORY_CARE_PROVIDER_SITE_OTHER): Payer: Self-pay | Admitting: Nurse Practitioner

## 2019-02-22 ENCOUNTER — Ambulatory Visit (INDEPENDENT_AMBULATORY_CARE_PROVIDER_SITE_OTHER): Payer: Medicare Other | Admitting: Nurse Practitioner

## 2019-02-22 ENCOUNTER — Other Ambulatory Visit: Payer: Self-pay

## 2019-02-22 ENCOUNTER — Encounter (INDEPENDENT_AMBULATORY_CARE_PROVIDER_SITE_OTHER): Payer: Self-pay | Admitting: *Deleted

## 2019-02-22 DIAGNOSIS — R1030 Lower abdominal pain, unspecified: Secondary | ICD-10-CM

## 2019-02-22 DIAGNOSIS — K5732 Diverticulitis of large intestine without perforation or abscess without bleeding: Secondary | ICD-10-CM | POA: Insufficient documentation

## 2019-02-22 DIAGNOSIS — K2270 Barrett's esophagus without dysplasia: Secondary | ICD-10-CM | POA: Diagnosis not present

## 2019-02-22 MED ORDER — AMOXICILLIN-POT CLAVULANATE 875-125 MG PO TABS: 1.0000 | ORAL_TABLET | Freq: Two times a day (BID) | ORAL | 0 refills | Status: DC

## 2019-02-22 NOTE — Patient Instructions (Signed)
1. Complete the ordered blood tests today  2. Schedule an abdominal/pelvic CT scan asap  3. Stop Cipro and Stop Metronidazole  4. Start Augmentin 875mg  one tab by mouth twice daily x 10 days   5. Push fluids, soft bland diet for 24 hours then advance as tolerated  6. Miralax 1 capful mixed in 8 ounces of water at bed time for constipation as needed.   7. Call our office if your symptoms worsen, go to the local emergency room if you develop severe abdominal pain  8. Follow up in the office in 4 weeks, we will schedule your EGD at that time

## 2019-02-23 LAB — COMPLETE METABOLIC PANEL WITH GFR
AG Ratio: 1.6 (calc) (ref 1.0–2.5)
ALT: 18 U/L (ref 9–46)
AST: 22 U/L (ref 10–35)
Albumin: 4 g/dL (ref 3.6–5.1)
Alkaline phosphatase (APISO): 61 U/L (ref 35–144)
BUN: 14 mg/dL (ref 7–25)
CO2: 29 mmol/L (ref 20–32)
Calcium: 9.4 mg/dL (ref 8.6–10.3)
Chloride: 103 mmol/L (ref 98–110)
Creat: 1.14 mg/dL (ref 0.70–1.18)
GFR, Est African American: 71 mL/min/{1.73_m2} (ref >=60)
GFR, Est Non African American: 62 mL/min/{1.73_m2} (ref >=60)
Globulin: 2.5 g/dL (calc) (ref 1.9–3.7)
Glucose, Bld: 95 mg/dL (ref 65–139)
Potassium: 4.7 mmol/L (ref 3.5–5.3)
Sodium: 137 mmol/L (ref 135–146)
Total Bilirubin: 0.6 mg/dL (ref 0.2–1.2)
Total Protein: 6.5 g/dL (ref 6.1–8.1)

## 2019-02-23 LAB — CBC WITH DIFFERENTIAL/PLATELET
Absolute Monocytes: 602 cells/uL (ref 200–950)
Basophils Absolute: 51 cells/uL (ref 0–200)
Basophils Relative: 0.8 %
Eosinophils Absolute: 141 cells/uL (ref 15–500)
Eosinophils Relative: 2.2 %
HCT: 47.1 % (ref 38.5–50.0)
Hemoglobin: 16.2 g/dL (ref 13.2–17.1)
Lymphs Abs: 1530 cells/uL (ref 850–3900)
MCH: 32.6 pg (ref 27.0–33.0)
MCHC: 34.4 g/dL (ref 32.0–36.0)
MCV: 94.8 fL (ref 80.0–100.0)
MPV: 12.2 fL (ref 7.5–12.5)
Monocytes Relative: 9.4 %
Neutro Abs: 4077 cells/uL (ref 1500–7800)
Neutrophils Relative %: 63.7 %
Platelets: 236 10*3/uL (ref 140–400)
RBC: 4.97 10*6/uL (ref 4.20–5.80)
RDW: 12.4 % (ref 11.0–15.0)
Total Lymphocyte: 23.9 %
WBC: 6.4 10*3/uL (ref 3.8–10.8)

## 2019-02-23 LAB — C-REACTIVE PROTEIN: CRP: 24.9 mg/L — ABNORMAL HIGH (ref <8.0)

## 2019-02-25 ENCOUNTER — Ambulatory Visit (HOSPITAL_COMMUNITY)
Admission: RE | Admit: 2019-02-25 | Discharge: 2019-02-25 | Disposition: A | Discharge status: Home / Self Care | Payer: Medicare Other | Source: Ambulatory Visit | Attending: Nurse Practitioner | Admitting: Nurse Practitioner

## 2019-02-25 ENCOUNTER — Other Ambulatory Visit: Payer: Self-pay

## 2019-02-25 ENCOUNTER — Encounter (HOSPITAL_COMMUNITY): Payer: Self-pay

## 2019-02-25 DIAGNOSIS — R1030 Lower abdominal pain, unspecified: Secondary | ICD-10-CM | POA: Insufficient documentation

## 2019-02-25 DIAGNOSIS — K5732 Diverticulitis of large intestine without perforation or abscess without bleeding: Secondary | ICD-10-CM | POA: Diagnosis present

## 2019-02-25 MED ORDER — IOHEXOL 300 MG/ML  SOLN
100.0000 mL | Freq: Once | INTRAMUSCULAR | Status: AC | PRN
  Administered 2019-02-25: 10:00:00 100 mL via INTRAVENOUS

## 2019-03-27 NOTE — Progress Notes (Deleted)
   Subjective:    Patient ID: Ryan Richards, male    DOB: [birth year], 78 y.o.   MRN: [id number]  HPI Ryan Richards is a 78 year old male with a past medical history of hypertension, COPD, arthritis, cervical stenosis, diverticulitis, GERD, colon polyps and prostate cancer 2002 s/p prostatectomy. He was last seen in the office on 02/22/2019 with persistent lower abdominal pain despite being on Cipro and Flagyl for presumed diverticulitis. Cipro and Flagyl were discontinued. CRP 24.9. WBC 6.4.  An abdominal/pelvic CT 02/25/2019 identified acute sigmoid diverticulitis without evidence of a perforation or abscess. I prescribed Augmentin 875mg  po bid x 10 days and his abdominal pain resolved.   Abdominal/pelvic CT with oral and IV contrast 02/25/2019: 1. Acute sigmoid diverticulitis. No perforation or abscess identified. 2.  Aortic Atherosclerosis. 3. Chronic asymmetric elevation of left hemidiaphragm.  He underwent a colonoscopy with Dr. Laural Golden on 01/13/2019 which identified sigmoid diverticulosis, one 5 mm tubular adenomatous/hyperplastic polyp in the distal sigmoid colon was removed and external and internal hemorrhoids. He was advised to repeat a colonoscopy in 5 years. His mother was diagnosed with colon cancer which required a colostomy in her 40's.   His most recent EGD was 08/23/2015 which showed a 2cm hiatal hernia and esophageal biopsies showed short segment Barrett's esophagus as verified by Dr. Laural Golden. He is currently taking omeprazole 20 mg once daily.  History of hemochromatosis. He reports having elevated iron levels in 2017 after his neck surgery which occurred after he took [facility] with iron for 2 weeks post surgery. He was evaluated by a hematologist for possible hemochromatosis. He underwent weekly phlebotomy for a few months which was discontinued after his hemoglobin dropped too low. His labs done 02/22/2019 RBC 4.97. Hg 16.2. HCT 47.1.   Review of Systems     Objective:   Physical Exam        Assessment & Plan:   1. Acute sigmoid diverticulitis initially did not resolve on Cipro and Flagyl, resolved after Augmentin 875mg  po bid x 10 days  2. GERD with Barrett's esophagus -I will verify EGD recall date with Dr. Laural Golden   3. Personal history of colon polyps, positive family history of colon cancer -Next colonoscopy due August 2025  4. History of Hemochromatosis.  -repeat CBC, iron, iron saturation, Ferritin, transferrin saturation

## 2019-03-29 ENCOUNTER — Ambulatory Visit (INDEPENDENT_AMBULATORY_CARE_PROVIDER_SITE_OTHER): Payer: Medicare Other | Admitting: Nurse Practitioner

## 2019-11-08 ENCOUNTER — Ambulatory Visit (INDEPENDENT_AMBULATORY_CARE_PROVIDER_SITE_OTHER): Payer: Medicare Other | Admitting: Cardiology

## 2019-11-08 ENCOUNTER — Encounter: Payer: Self-pay | Admitting: *Deleted

## 2019-11-08 ENCOUNTER — Ambulatory Visit (INDEPENDENT_AMBULATORY_CARE_PROVIDER_SITE_OTHER): Payer: Medicare Other

## 2019-11-08 ENCOUNTER — Other Ambulatory Visit: Payer: Self-pay

## 2019-11-08 ENCOUNTER — Encounter: Payer: Self-pay | Admitting: Cardiology

## 2019-11-08 VITALS — BP 142/72 | HR 51 | Ht 72.0 in | Wt 225.8 lb

## 2019-11-08 DIAGNOSIS — I440 Atrioventricular block, first degree: Secondary | ICD-10-CM

## 2019-11-08 DIAGNOSIS — I10 Essential (primary) hypertension: Secondary | ICD-10-CM

## 2019-11-08 DIAGNOSIS — I499 Cardiac arrhythmia, unspecified: Secondary | ICD-10-CM

## 2019-11-08 DIAGNOSIS — I493 Ventricular premature depolarization: Secondary | ICD-10-CM

## 2019-11-08 DIAGNOSIS — R0602 Shortness of breath: Secondary | ICD-10-CM

## 2019-11-08 HISTORY — DX: Cardiac arrhythmia, unspecified: I49.9

## 2019-11-08 NOTE — Progress Notes (Signed)
Cardiology Office Note  Date: 11/08/2019   ID: Ryan Richards, DOB [birth year], MRN [id number]  PCP:  Denny Levy, PA  Cardiologist:  Rozann Lesches, MD Electrophysiologist:  None   Chief Complaint  Patient presents with  . Abnormal ECG    History of Present Illness: Ryan Richards is a 79 y.o. male referred for cardiology consultation by Ms. Worley PA-C due to finding of prolonged PR interval and PVCs on recent ECG.  He does not describe any definite sense of palpitations.  He has chronic dyspnea associated with COPD by his description, uses inhalers as outlined below.  No definite angina symptoms, no sudden dizziness or syncope.  I personally reviewed the recent tracing from May 27 which shows sinus rhythm with prolonged PR interval of 219 ms, occasional PVCs, leftward axis.  I reviewed his medications which are outlined below.  He is on Norvasc for control of blood pressure.  He underwent a remote cardiac catheterization many years ago that was reportedly reassuring.  No recent assessment of LVEF.  Past Medical History:  Diagnosis Date  . Arthritis   . Bronchitis   . COPD (chronic obstructive pulmonary disease) ([place])   . Essential hypertension   . GERD (gastroesophageal reflux disease)   . Headache   . History of pneumonia 2015  . Prostate cancer ([place])   . Urinary frequency     Past Surgical History:  Procedure Laterality Date  . ANTERIOR CERVICAL DECOMP/DISCECTOMY FUSION N/A 03/20/2015   Procedure: C4-5 C5-6 C6-7 Anterior cervical decompression/diskectomy/fusion;  Surgeon: Earnie Larsson, MD;  Location: [place] [facility];  Service: Neurosurgery;  Laterality: N/A;  C4-5 C5-6 C6-7 Anterior cervical decompression/diskectomy/fusion  . BACK SURGERY     x4  . CARDIAC CATHETERIZATION  1992   pt. states that everything was fine  . COLONOSCOPY  2012   Through [facility] in [place].  Marland Kitchen COLONOSCOPY N/A 01/13/2019   Procedure: COLONOSCOPY;  Surgeon: Rogene Houston, MD;  [facility];  Service: Endoscopy;  Laterality: N/A;  730  . ESOPHAGOGASTRODUODENOSCOPY N/A 08/23/2015   Procedure: ESOPHAGOGASTRODUODENOSCOPY (EGD);  Surgeon: Rogene Houston, MD;  [facility];  Service: Endoscopy;  Laterality: N/A;  1:45  . KNEE SURGERY Left 1972  . POLYPECTOMY  01/13/2019   Procedure: POLYPECTOMY;  Surgeon: Rogene Houston, MD;  [facility];  Service: Endoscopy;;  sigmoid colon  . PROSTATE SURGERY     removal    Current Outpatient Medications  Medication Sig Dispense Refill  . acetaminophen (TYLENOL) 500 MG tablet Take 1,000 mg by mouth every 6 (six) hours as needed for moderate pain.    Marland Kitchen albuterol (PROVENTIL HFA;VENTOLIN HFA) 108 (90 BASE) MCG/ACT inhaler Inhale 2 puffs into the lungs every 6 (six) hours as needed for wheezing or shortness of breath.    Marland Kitchen albuterol (PROVENTIL) (2.5 MG/3ML) 0.083% nebulizer solution Inhale 3 mLs into the lungs every 6 (six) hours as needed for shortness of breath.    . ALPRAZolam (XANAX) 0.25 MG tablet Take 0.5 tablets by mouth at bedtime.    Marland Kitchen amLODipine (NORVASC) 5 MG tablet Take 5 mg by mouth daily.    . Glucos-Chond-Hyal Ac-Ca Fructo (MOVE FREE JOINT HEALTH ADVANCE) TABS Take 2 tablets by mouth every evening.    Marland Kitchen MISC NATURAL PRODUCTS PO Take 3 capsules by mouth in the morning and at bedtime. Lung Support    . Multiple Vitamin (MULTIVITAMIN) tablet Take 1 tablet by mouth daily.    Marland Kitchen  nabumetone (RELAFEN) 500 MG tablet Take 500 mg by mouth daily as needed (inflammation pain).    . Omega-3 Fatty Acids (FISH OIL) 600 MG CAPS Take 1,800 mg by mouth every evening. Takes 3-4 x per week    . omeprazole (PRILOSEC) 20 MG capsule Take 20 mg by mouth daily.    . Polyvinyl Alcohol-Povidone (CLEAR EYES NATURAL TEARS OP) Place 1 drop into both eyes daily as needed (dry eyes).     No current facility-administered medications for this visit.   Allergies:  Codeine and Morphine and related   Social History: The patient   reports that he quit smoking about 24 years ago. He has never used smokeless tobacco. He reports that he does not drink alcohol or use drugs.   Family History: The patient's family history includes Aneurysm in his father; Arthritis in his father; Colon cancer in his mother; Healthy in his sister, sister, sister, and sister; Heart disease in his maternal aunt; Hepatitis C in his brother; Hiatal hernia in his sister and sister.   ROS:   Prior history of cervical fusion with decreased range of motion, reports phrenic nerve paralysis and abnormal diaphragmatic motion.  Physical Exam: VS:  BP (!) 142/72   Pulse (!) 51   Ht 6' (1.829 m)   Wt 225 lb 12.8 oz (102.4 kg)   SpO2 95%   BMI 30.62 kg/m , BMI Body mass index is 30.62 kg/m.  Wt Readings from Last 3 Encounters:  11/08/19 225 lb 12.8 oz (102.4 kg)  02/22/19 208 lb 11.2 oz (94.7 kg)  01/13/19 211 lb (95.7 kg)    General: Elderly male, appears comfortable at rest. HEENT: Conjunctiva and lids normal, wearing a mask. Neck: Supple, no elevated JVP or carotid bruits, no thyromegaly. Lungs: Clear to auscultation, nonlabored breathing at rest. Cardiac: Regular rate and rhythm with ectopic beats, no S3, 2/6 systolic murmur, no pericardial rub. Abdomen: Soft, nontender, bowel sounds present. Extremities: No pitting edema, distal pulses 2+. Skin: Warm and dry. Musculoskeletal: No kyphosis. Neuropsychiatric: Alert and oriented x3, affect grossly appropriate.  ECG:  An ECG dated 08/01/2015 was personally reviewed today and demonstrated:  Sinus rhythm with leftward axis.  Recent Labwork: 02/22/2019: ALT 18; AST 22; BUN 14; Creat 1.14; Hemoglobin 16.2; Platelets 236; Potassium 4.7; Sodium 137   Other Studies Reviewed Today:  No prior cardiac testing for review today.  Assessment and Plan:  1.  Occasional to frequent PVCs, not clearly symptomatic however.  We will obtain a 24-hour monitor for further quantification and also an echocardiogram  to assess cardiac structure and function.  No change in medications for now.  2.  Prolonged PR interval on ECG, likely a reflection of slowing in the conduction system over the years and of no clear clinical significance at this time.  3.  Essential hypertension, on Norvasc.  Medication Adjustments/Labs and Tests Ordered: Current medicines are reviewed at length with the patient today.  Concerns regarding medicines are outlined above.   Tests Ordered: Orders Placed This Encounter  Procedures  . LONG TERM MONITOR (3-14 DAYS)  . ECHOCARDIOGRAM COMPLETE    Medication Changes: No orders of the defined types were placed in this encounter.   Disposition:  Follow up test results.  Signed, Satira Sark, MD, [facility] 11/08/2019 1:51 PM    [facility] [place], [place], [place] [address] Phone: [phone]; Fax: [phone]

## 2019-11-08 NOTE — Patient Instructions (Addendum)
Medication Instructions:   Your physician recommends that you continue on your current medications as directed. Please refer to the Current Medication list given to you today.  Labwork:  NONE  Testing/Procedures: Your physician has requested that you have an echocardiogram. Echocardiography is a painless test that uses sound waves to create images of your heart. It provides your doctor with information about the size and shape of your heart and how well your hearts chambers and valves are working. This procedure takes approximately one hour. There are no restrictions for this procedure. Your physician has recommended that you wear a holter monitor for 24 hours. Holter monitors are medical devices that record the hearts electrical activity. Doctors most often use these monitors to diagnose arrhythmias. Arrhythmias are problems with the speed or rhythm of the heartbeat. The monitor is a small, portable device. You can wear one while you do your normal daily activities. This is usually used to diagnose what is causing palpitations/syncope (passing out). [employer] will contact you about this monitor.   Follow-Up:  Your physician recommends that you schedule a follow-up appointment in: pending.  Any Other Special Instructions Will Be Listed Below (If Applicable).  If you need a refill on your cardiac medications before your next appointment, please call your pharmacy.

## 2019-11-22 ENCOUNTER — Ambulatory Visit (INDEPENDENT_AMBULATORY_CARE_PROVIDER_SITE_OTHER): Payer: Medicare Other

## 2019-11-22 ENCOUNTER — Other Ambulatory Visit: Payer: Self-pay

## 2019-11-22 DIAGNOSIS — I493 Ventricular premature depolarization: Secondary | ICD-10-CM

## 2019-11-22 DIAGNOSIS — R0602 Shortness of breath: Secondary | ICD-10-CM

## 2019-11-24 ENCOUNTER — Telehealth: Payer: Self-pay | Admitting: *Deleted

## 2019-11-24 NOTE — Telephone Encounter (Signed)
Pt aware - routed to pcp  

## 2019-11-24 NOTE — Telephone Encounter (Signed)
-----   Message from Erma Heritage, [place] sent at 11/23/2019  9:27 AM EDT ----- Covering for Dr. Domenic Polite - Please let the patient know his echocardiogram showed normal pumping function of the heart with a preserved EF of 55-60%. He did have mildly abnormal relaxation which is common to see with age and HTN. Also noted to have mild leakage along the aortic valve and mild dilation of the aortic root which are things we will continue to follow with routine echocardiograms. Overall, no significant abnormalities. Please forward a copy of results to [place], [place], [place].

## 2019-11-29 ENCOUNTER — Telehealth: Payer: Self-pay | Admitting: *Deleted

## 2019-11-29 NOTE — Telephone Encounter (Signed)
Patient informed. Copy sent to PCP °

## 2019-11-29 NOTE — Telephone Encounter (Signed)
-----   Message from Satira Sark, MD sent at 11/27/2019  7:53 PM EDT ----- Results reviewed.  Please let him know that the cardiac monitor showed overall normal rhythm.  He had occasional PVCs representing only 1% total beats, more frequent PACs and brief bursts of SVT, but nothing that looked dangerous or specifically requires treatment at this time.  LVEF normal by recent echocardiogram with mildly dilated aortic root and mildly regurgitation.  I would recommend that he follow-up with his PCP and have them schedule a repeat echocardiogram in 1 year.

## 2019-12-07 ENCOUNTER — Telehealth: Payer: Self-pay | Admitting: Internal Medicine

## 2019-12-07 NOTE — Telephone Encounter (Signed)
I talked to [place] - pt needs ov at 1:30  12/08/19 in [place] with me - get Patrice to open it then as I will be there anyway  - tell him to bring all his meds, no need for xrays they're in the system

## 2019-12-07 NOTE — Telephone Encounter (Signed)
Routing this encounter to Dr. Melvyn Novas so he can contact Dr. Pleas Koch. Dr. Melvyn Novas, please advise.

## 2019-12-07 NOTE — Telephone Encounter (Signed)
Spoke with the pt and added on for 1:30 12/08/19.

## 2019-12-08 ENCOUNTER — Other Ambulatory Visit: Payer: Self-pay

## 2019-12-08 ENCOUNTER — Ambulatory Visit: Payer: Medicare Other | Admitting: Internal Medicine

## 2019-12-08 ENCOUNTER — Encounter: Payer: Self-pay | Admitting: Internal Medicine

## 2019-12-08 DIAGNOSIS — J90 Pleural effusion, not elsewhere classified: Secondary | ICD-10-CM | POA: Diagnosis not present

## 2019-12-08 DIAGNOSIS — G588 Other specified mononeuropathies: Secondary | ICD-10-CM | POA: Diagnosis not present

## 2019-12-08 DIAGNOSIS — G5680 Other specified mononeuropathies of unspecified upper limb: Secondary | ICD-10-CM

## 2019-12-08 NOTE — Patient Instructions (Addendum)
Omeprazole 40 mg Take 30-60 min before first meal of the day   GERD (REFLUX)  is an extremely common cause of respiratory symptoms just like yours , many times with no obvious heartburn at all.    It can be treated with medication, but also with lifestyle changes including elevation of the head of your bed (ideally with 6 -8inch blocks under the headboard of your bed),  Smoking cessation, avoidance of late meals, excessive alcohol, and avoid fatty foods, chocolate, peppermint, colas, red wine, and acidic juices such as orange juice.  NO MINT OR MENTHOL PRODUCTS SO NO COUGH DROPS  USE SUGARLESS CANDY INSTEAD (Jolley ranchers or Stover's or Life Savers) or even ice chips will also do - the key is to swallow to prevent all throat clearing. NO OIL BASED VITAMINS - use powdered substitutes.  Avoid fish oil when coughing.    Ok to use   Mucus relief   We will call to schedule you an ultrasound guided thoracentesis as soon as possible   If can't get your breath in your recliner at less than 60 degrees

## 2019-12-08 NOTE — Progress Notes (Signed)
Ryan Richards, male    DOB: 06/07/1941, 79 y.o.   MRN: [id number]   Brief patient profile:  79 yowm quit smoking around 1999  Retired from roofing/ welding  ? 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 Pulmonary Consult    Referred by Dr. Pleas Koch- pleural effusion. Pt c/o increased SOB x 5 wks- better when he lies on his left side. He also c/o non prod cough   Dyspnea:  50 ft  Cough: still clearing throat esp in am but  Sleep: about 45 degrees  SABA use: rarely needed previously but up tid but since ER eval 6/29 much less and got one shot of prednisone   6 lb, poor appetite  Pain above R breast x sev weeks   No obvious day to day or daytime variability or assoc excess/ purulent sputum or mucus plugs or hemoptysis or   chest tightness, subjective wheeze or overt sinus or hb symptoms.     Also denies any obvious fluctuation of symptoms with weather or environmental changes or other aggravating or alleviating factors except as outlined above   No unusual exposure hx or h/o childhood pna/ asthma or knowledge of premature birth.  Current Allergies, Complete Past Medical History, Past Surgical History, Family History, and Social History were reviewed in [employer] record.  ROS  The following are not active complaints unless bolded Hoarseness, sore throat, dysphagia, dental problems, itching, sneezing,  nasal congestion or discharge  of excess mucus or purulent secretions, ear ache,   fever, chills, sweats, unintended wt loss or wt gain, classically  exertional cp,  orthopnea pnd or arm/hand swelling  or leg swelling, presyncope, palpitations, abdominal pain, anorexia/fatigue, nausea, vomiting, diarrhea  or change in bowel habits or change in bladder habits, change in stools or change in urine, dysuria, hematuria,  rash, arthralgias, visual complaints, headache, numbness, weakness or ataxia or problems with walking or coordination,  change in mood or  memory.           Past Medical History:  Diagnosis Date  . Arthritis   . Bronchitis   . COPD (chronic obstructive pulmonary disease) ([place])   . Essential hypertension   . GERD (gastroesophageal reflux disease)   . Headache   . History of pneumonia 2015  . Prostate cancer ([place])   . Urinary frequency     Outpatient Medications Prior to Visit  Medication Sig Dispense Refill  . acetaminophen (TYLENOL) 500 MG tablet Take 1,000 mg by mouth every 6 (six) hours as needed for moderate pain.    Marland Kitchen albuterol (PROVENTIL HFA;VENTOLIN HFA) 108 (90 BASE) MCG/ACT inhaler Inhale 2 puffs into the lungs every 6 (six) hours as needed for wheezing or shortness of breath.    Marland Kitchen albuterol (PROVENTIL) (2.5 MG/3ML) 0.083% nebulizer solution Inhale 3 mLs into the lungs every 6 (six) hours as needed  for shortness of breath.    . ALPRAZolam (XANAX) 0.25 MG tablet Take 0.5 tablets by mouth at bedtime.    Marland Kitchen amLODipine (NORVASC) 5 MG tablet Take 5 mg by mouth daily.    . Ibuprofen (ADVIL MIGRAINE) 200 MG CAPS Take by mouth as needed.    Marland Kitchen MISC NATURAL PRODUCTS PO Take 3 capsules by mouth in the morning and at bedtime. Lung Support    . Multiple Vitamin (MULTIVITAMIN) tablet Take 1 tablet by mouth daily.    . Omega-3 Fatty Acids (FISH OIL) 600 MG CAPS Take 1,800 mg by mouth every evening. Takes 3-4 x per week    . omeprazole (PRILOSEC) 20 MG capsule Take 20 mg by mouth daily.    . Glucos-Chond-Hyal Ac-Ca  Fructo (MOVE FREE JOINT HEALTH ADVANCE) TABS Take 2 tablets by mouth every evening.    . nabumetone (RELAFEN) 500 MG tablet Take 500 mg by mouth daily as needed (inflammation pain).    .       . Polyvinyl Alcohol-Povidone (CLEAR EYES NATURAL TEARS OP) Place 1 drop into both eyes daily as needed (dry eyes).        Objective:     BP 140/84 (BP Location: Left Arm, Cuff Size: Normal)   Pulse 80   Temp 97.8 F (36.6 C) (Oral)   Ht 6' (1.829 m)   Wt 216 lb (98 kg)   SpO2 95% Comment: on RA  BMI 29.29 kg/m   SpO2: 95 % (on RA)   amb wm nad    HEENT : pt wearing mask not removed for exam due to covid -19 concerns.    NECK :  without JVD/Nodes/TM/ nl carotid upstrokes bilaterally   LUNGS: no acc muscle use,  Nl contour chest with decreased bs bases  bilaterally  With dullness R > L   CV:  RRR  no s3 or murmur or increase in P2, and no edema   ABD:  soft and nontender with nl inspiratory excursion in the supine position. No bruits or organomegaly appreciated, bowel sounds nl  MS:  Nl gait/ ext warm without deformities, calf tenderness, cyanosis or clubbing No obvious joint restrictions   SKIN: warm and dry without lesions    NEURO:  alert, approp, nl sensorium with  no motor or cerebellar deficits apparent.      I personally reviewed images and agree with radiology impression as follows:   Chest CTa 12/06/19 [facility] 1. No pulmonary embolus. 2. Moderate to large right pleural effusion with compressive atelectasis, near complete atelectasis of the right lower lobe and partial atelectasis of the upper lobe. Mild pleural calcifications anteriorly in the right hemithorax are chronic and also seen on prior exam. This raises possibility of asbestos exposure. Consider pleural fluid sampling. 3. Prominent right hilar node is likely reactive, but nonspecific. 4. Chronic elevation of the left hemidiaphragm with adjacent atelectasis/scarring.      Assessment   Pleural  effusion on right Onset of symptoms march / April 2021 with baseline cxr 6//8/21 = elevated L HD, nl r side  - Echo ok 11/22/19  - CT chest 12/06/19  Mod R effuision / atx RLL with midline trachea  Most c/w parapneumonic process by radiography studies but pt has had downhill course since March/April raising concern for malignancy and he has smoking hx and likely asbestos exposure hx/ pleural calcifications and unexplained ant chest discomfort which has pleuritic feature with no pna clinically at this point so needs dx/ therapuetic t centesis  asap.  Because lacks L Phrenic nerve function has bilateral dysfunction of the major "ventilator pump" and now can only sleep at 30 - 45 degrees = > advised is this worsens will need admit [facility] in [place] but will try to expedite outpt w/u  Discussed in detail all the  indications, usual  risks and alternatives  relative to the benefits with patient who agrees to proceed with w/u as outlined.        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  Total time for H and P, chart review, counseling,  and generating customized AVS unique to this office visit / charting = 60 min        Christinia Gully, MD 12/08/2019

## 2019-12-08 NOTE — Assessment & Plan Note (Addendum)
Onset of symptoms march / April 2021 with baseline cxr 6//8/21 = elevated L HD, nl r side  - Echo ok 11/22/19  - CT chest 12/06/19  Mod R effuision / atx RLL with midline trachea  Most c/w parapneumonic process by radiography studies but pt has had downhill course since March/April raising concern for malignancy and he has smoking hx and likely asbestos exposure hx/ pleural calcifications and unexplained ant chest discomfort which has pleuritic feature with no pna clinically at this point so needs dx/ therapuetic t centesis asap.  Because lacks L Phrenic nerve function has bilateral dysfunction of the major "ventilator pump" and now can only sleep at 30 - 45 degrees = > advised is this worsens will need admit [facility] in [place] but will try to expedite outpt w/u  Discussed in detail all the  indications, usual  risks and alternatives  relative to the benefits with patient who agrees to proceed with w/u as outlined.             Each maintenance medication was reviewed in detail including emphasizing most importantly the difference between maintenance and prns and under what circumstances the prns are to be triggered using an action plan format where appropriate.  Total time for H and P, chart review, counseling,  and generating customized AVS unique to this office visit / charting = 60 min

## 2019-12-08 NOTE — Assessment & Plan Note (Signed)
See concerns with new R effusion > admit prn while awaiting T centesis

## 2019-12-09 ENCOUNTER — Other Ambulatory Visit: Payer: Self-pay

## 2019-12-09 ENCOUNTER — Other Ambulatory Visit (HOSPITAL_COMMUNITY)
Admission: RE | Admit: 2019-12-09 | Discharge: 2019-12-09 | Disposition: A | Discharge status: Home / Self Care | Payer: Medicare Other | Source: Ambulatory Visit | Attending: Internal Medicine | Admitting: Internal Medicine

## 2019-12-09 DIAGNOSIS — Z01812 Encounter for preprocedural laboratory examination: Secondary | ICD-10-CM | POA: Insufficient documentation

## 2019-12-09 DIAGNOSIS — Z20822 Contact with and (suspected) exposure to covid-19: Secondary | ICD-10-CM | POA: Insufficient documentation

## 2019-12-09 LAB — SARS CORONAVIRUS 2 (TAT 6-24 HRS): SARS Coronavirus 2: NEGATIVE

## 2019-12-13 ENCOUNTER — Encounter (HOSPITAL_COMMUNITY): Payer: Self-pay

## 2019-12-13 ENCOUNTER — Ambulatory Visit (HOSPITAL_COMMUNITY)
Admission: RE | Admit: 2019-12-13 | Discharge: 2019-12-13 | Disposition: A | Discharge status: Home / Self Care | Payer: Medicare Other | Source: Ambulatory Visit | Attending: Internal Medicine | Admitting: Internal Medicine

## 2019-12-13 ENCOUNTER — Other Ambulatory Visit: Payer: Self-pay

## 2019-12-13 ENCOUNTER — Ambulatory Visit (HOSPITAL_COMMUNITY)
Admission: RE | Admit: 2019-12-13 | Discharge: 2019-12-13 | Disposition: A | Discharge status: Home / Self Care | Payer: Medicare Other | Source: Ambulatory Visit | Attending: Diagnostic Radiology | Admitting: Diagnostic Radiology

## 2019-12-13 DIAGNOSIS — J90 Pleural effusion, not elsewhere classified: Secondary | ICD-10-CM | POA: Diagnosis not present

## 2019-12-13 DIAGNOSIS — Z9889 Other specified postprocedural states: Secondary | ICD-10-CM

## 2019-12-13 LAB — LACTATE DEHYDROGENASE, PLEURAL OR PERITONEAL FLUID: LD, Fluid: 709 U/L — ABNORMAL HIGH (ref 3–23)

## 2019-12-13 LAB — BODY FLUID CELL COUNT WITH DIFFERENTIAL
Eos, Fluid: 24 %
Lymphs, Fluid: 53 %
Monocyte-Macrophage-Serous Fluid: 8 % — ABNORMAL LOW (ref 50–90)
Neutrophil Count, Fluid: 15 % (ref 0–25)
Total Nucleated Cell Count, Fluid: 1519 cu mm — ABNORMAL HIGH (ref 0–1000)

## 2019-12-13 LAB — GRAM STAIN

## 2019-12-13 LAB — ALBUMIN, PLEURAL OR PERITONEAL FLUID: Albumin, Fluid: 2.3 g/dL

## 2019-12-13 LAB — AMYLASE, PLEURAL OR PERITONEAL FLUID: Amylase, Fluid: 29 U/L

## 2019-12-13 LAB — GLUCOSE, PLEURAL OR PERITONEAL FLUID: Glucose, Fluid: 99 mg/dL

## 2019-12-13 NOTE — Procedures (Signed)
PreOperative Dx: RT pleural effusion Postoperative Dx: RT pleural effusion Procedure:   US guided RT thoracentesis Radiologist:  Thornton Papas Anesthesia:  10 ml of 1% lidocaine Specimen:  450 mL of dark old bloody colored fluid EBL:   < 1 ml Complications: None

## 2019-12-13 NOTE — Progress Notes (Signed)
Thoracentesis complete no signs of distress.  

## 2019-12-14 LAB — TRIGLYCERIDES, BODY FLUIDS: Triglycerides, Fluid: 26 mg/dL

## 2019-12-14 LAB — PROTEIN, BODY FLUID (OTHER): Total Protein, Body Fluid Other: 3.7 g/dL

## 2019-12-14 LAB — CYTOLOGY - NON PAP

## 2019-12-15 ENCOUNTER — Telehealth: Payer: Self-pay | Admitting: Internal Medicine

## 2019-12-15 NOTE — Telephone Encounter (Signed)
Patient notified that Dr. Melvyn Novas is out of the office and that we will call him as soon as Dr. Melvyn Novas reviews all of his test results. Patient verbalized understanding.

## 2019-12-19 ENCOUNTER — Emergency Department (HOSPITAL_COMMUNITY): Payer: Medicare Other

## 2019-12-19 ENCOUNTER — Emergency Department (HOSPITAL_COMMUNITY)
Admission: EM | Admit: 2019-12-19 | Discharge: 2019-12-19 | Disposition: A | Discharge status: Home / Self Care | Payer: Medicare Other | Attending: Emergency Medicine | Admitting: Emergency Medicine

## 2019-12-19 ENCOUNTER — Institutional Professional Consult (permissible substitution): Payer: Medicare Other | Admitting: Internal Medicine

## 2019-12-19 ENCOUNTER — Encounter (HOSPITAL_COMMUNITY): Payer: Self-pay | Admitting: Emergency Medicine

## 2019-12-19 ENCOUNTER — Telehealth: Payer: Self-pay | Admitting: Pulmonary Disease

## 2019-12-19 ENCOUNTER — Other Ambulatory Visit: Payer: Self-pay

## 2019-12-19 DIAGNOSIS — I10 Essential (primary) hypertension: Secondary | ICD-10-CM | POA: Diagnosis not present

## 2019-12-19 DIAGNOSIS — J90 Pleural effusion, not elsewhere classified: Secondary | ICD-10-CM | POA: Insufficient documentation

## 2019-12-19 DIAGNOSIS — R0602 Shortness of breath: Secondary | ICD-10-CM | POA: Diagnosis present

## 2019-12-19 DIAGNOSIS — Z79899 Other long term (current) drug therapy: Secondary | ICD-10-CM | POA: Insufficient documentation

## 2019-12-19 DIAGNOSIS — Z87891 Personal history of nicotine dependence: Secondary | ICD-10-CM | POA: Diagnosis not present

## 2019-12-19 DIAGNOSIS — J449 Chronic obstructive pulmonary disease, unspecified: Secondary | ICD-10-CM | POA: Insufficient documentation

## 2019-12-19 DIAGNOSIS — C61 Malignant neoplasm of prostate: Secondary | ICD-10-CM | POA: Diagnosis not present

## 2019-12-19 DIAGNOSIS — Z800 Family history of malignant neoplasm of digestive organs: Secondary | ICD-10-CM | POA: Insufficient documentation

## 2019-12-19 LAB — CBC WITH DIFFERENTIAL/PLATELET
Abs Immature Granulocytes: 0.02 10*3/uL (ref 0.00–0.07)
Basophils Absolute: 0.1 10*3/uL (ref 0.0–0.1)
Basophils Relative: 1 %
Eosinophils Absolute: 0.3 10*3/uL (ref 0.0–0.5)
Eosinophils Relative: 3 %
HCT: 46.7 % (ref 39.0–52.0)
Hemoglobin: 15.8 g/dL (ref 13.0–17.0)
Immature Granulocytes: 0 %
Lymphocytes Relative: 12 %
Lymphs Abs: 1 10*3/uL (ref 0.7–4.0)
MCH: 31.5 pg (ref 26.0–34.0)
MCHC: 33.8 g/dL (ref 30.0–36.0)
MCV: 93 fL (ref 80.0–100.0)
Monocytes Absolute: 0.9 10*3/uL (ref 0.1–1.0)
Monocytes Relative: 10 %
Neutro Abs: 6.2 10*3/uL (ref 1.7–7.7)
Neutrophils Relative %: 74 %
Platelets: 396 10*3/uL (ref 150–400)
RBC: 5.02 MIL/uL (ref 4.22–5.81)
RDW: 11.8 % (ref 11.5–15.5)
WBC: 8.4 10*3/uL (ref 4.0–10.5)
nRBC: 0 % (ref 0.0–0.2)

## 2019-12-19 LAB — BASIC METABOLIC PANEL
Anion gap: 11 (ref 5–15)
BUN: 14 mg/dL (ref 8–23)
CO2: 24 mmol/L (ref 22–32)
Calcium: 9.2 mg/dL (ref 8.9–10.3)
Chloride: 102 mmol/L (ref 98–111)
Creatinine, Ser: 0.89 mg/dL (ref 0.61–1.24)
GFR calc Af Amer: >60 mL/min (ref >60)
GFR calc non Af Amer: >60 mL/min (ref >60)
Glucose, Bld: 95 mg/dL (ref 70–99)
Potassium: 4 mmol/L (ref 3.5–5.1)
Sodium: 137 mmol/L (ref 135–145)

## 2019-12-19 LAB — CULTURE, BODY FLUID W GRAM STAIN -BOTTLE: Culture: NO GROWTH

## 2019-12-19 LAB — TROPONIN I (HIGH SENSITIVITY)
Troponin I (High Sensitivity): 5 ng/L (ref <18)
Troponin I (High Sensitivity): 6 ng/L (ref <18)

## 2019-12-19 LAB — URINALYSIS, ROUTINE W REFLEX MICROSCOPIC
Bilirubin Urine: NEGATIVE
Glucose, UA: NEGATIVE mg/dL
Hgb urine dipstick: NEGATIVE
Ketones, ur: 20 mg/dL — AB
Leukocytes,Ua: NEGATIVE
Nitrite: NEGATIVE
Protein, ur: NEGATIVE mg/dL
Specific Gravity, Urine: 1.02 (ref 1.005–1.030)
pH: 6 (ref 5.0–8.0)

## 2019-12-19 LAB — BRAIN NATRIURETIC PEPTIDE: B Natriuretic Peptide: 49 pg/mL (ref 0.0–100.0)

## 2019-12-19 MED ORDER — IOHEXOL 350 MG/ML SOLN
100.0000 mL | Freq: Once | INTRAVENOUS | Status: AC | PRN
  Administered 2019-12-19: 100 mL via INTRAVENOUS

## 2019-12-19 NOTE — Discharge Instructions (Addendum)
The CT scan of your chest today shows that you have a fluid collection at the right side of your lung.  No evidence of a blood clot of your lung.  Someone from Dr. Gustavus Bryant office should contact you tomorrow to arrange a follow-up appointment.  Return to the emergency department if you develop any worsening symptoms

## 2019-12-19 NOTE — ED Notes (Signed)
Ambulated pt 02 stayed at 57

## 2019-12-19 NOTE — Telephone Encounter (Signed)
Spoke to [facility] EDP Evaluated for larger effusion on C angio Thora results reviewed >> mononuclear exudate ,  Mesothelial cells on cytology Next step would be TCST referral for pleural biopsy, repeat thora if he gets more symptomatic EDP will d/w patient if he prefers hosp admission or would like [place] to arrange as outpatient. FYI to Dr wert to follow

## 2019-12-19 NOTE — ED Triage Notes (Addendum)
Pt c/o SOB x 6 weeks; reports he was diagnosed with pneumonia and had 1L fluids removed from right lung x 1 week ago; non-productive cough, pt c/o fatigue and weakness

## 2019-12-19 NOTE — ED Provider Notes (Signed)
[facility] EMERGENCY DEPARTMENT Provider Note   CSN: [id number] Arrival date & time: 12/19/19  1141     History Chief Complaint  Patient presents with  . Shortness of Breath    Ryan Richards is a 79 y.o. male.  HPI      Ryan Richards is a 79 y.o. male with PMH of COPD, prostate CA, phrenic nerve injury secondary to neck surgery in 2016,  who presents to the Emergency Department complaining of gradual onset of shortness of breath, generalized weakness and fatigue. Temporary improvement with albuterol inhaler use.  Symptoms have been present for 5-6 weeks.  He was seen by pulmonology, Dr. Melvyn Novas and had a right thoracentesis 6 days ago and had 450 mL of fluid removed from the right.  Initially, felt improved until 4 days ago when he developed more significant fatigue, dyspnea and weakness which prompted his ER visit today.  He denies chest pain, fever, chills, abdominal pain, nausea, vomiting, diarrhea.  Negative Covid test 10 days ago.  Past Medical History:  Diagnosis Date  . Arthritis   . Bronchitis   . COPD (chronic obstructive pulmonary disease) ([place])   . Essential hypertension   . GERD (gastroesophageal reflux disease)   . Headache   . History of pneumonia 2015  . Prostate cancer ([place])   . Urinary frequency     Patient Active Problem List   Diagnosis Date Noted  . Pleural effusion on right 12/08/2019  . 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 Diverticulitis of colon 02/22/2019  . Barrett's esophagus 02/22/2019  . Special screening for malignant neoplasms, colon 05/12/2018  . Cervical stenosis of spinal canal 03/20/2015  . GERD (gastroesophageal reflux disease) 07/26/2012  . Chest pain 07/25/2012  . HTN (hypertension) 07/25/2012    Past Surgical History:  Procedure Laterality Date  . ANTERIOR CERVICAL DECOMP/DISCECTOMY FUSION N/A 03/20/2015   Procedure: C4-5 C5-6 C6-7 Anterior cervical decompression/diskectomy/fusion;   Surgeon: Earnie Larsson, MD;  Location: [place] NEURO ORS;  Service: Neurosurgery;  Laterality: N/A;  C4-5 C5-6 C6-7 Anterior cervical decompression/diskectomy/fusion  . BACK SURGERY     x4  . CARDIAC CATHETERIZATION  1992   pt. states that everything was fine  . COLONOSCOPY  2012   Through [facility] in [place].  Marland Kitchen COLONOSCOPY N/A 01/13/2019   Procedure: COLONOSCOPY;  Surgeon: Rogene Houston, MD;  Location: [facility];  Service: Endoscopy;  Laterality: N/A;  730  . ESOPHAGOGASTRODUODENOSCOPY N/A 08/23/2015   Procedure: ESOPHAGOGASTRODUODENOSCOPY (EGD);  Surgeon: Rogene Houston, MD;  Location: [facility];  Service: Endoscopy;  Laterality: N/A;  1:45  . KNEE SURGERY Left 1972  . POLYPECTOMY  01/13/2019   Procedure: POLYPECTOMY;  Surgeon: Rogene Houston, MD;  Location: [facility];  Service: Endoscopy;;  sigmoid colon  . PROSTATE SURGERY     removal       Family History  Problem Relation Age of Onset  . Colon cancer Mother   . Arthritis Father   . Aneurysm Father   . Hiatal hernia Sister   . Hepatitis C Brother   . Healthy Sister   . Healthy Sister   . Hiatal hernia Sister   . Healthy Sister   . Healthy Sister   . Heart disease Maternal Aunt     Social History   Tobacco Use  . Smoking status: Former Smoker    Packs/day: 1.00    Years: 40.00    Pack years: 40.00  Quit date: 06/09/1997    Years since quitting: 22.5  . Smokeless tobacco: Never Used  Vaping Use  . Vaping Use: Never used  Substance Use Topics  . Alcohol use: No    Alcohol/week: 0.0 standard drinks  . Drug use: No    Home Medications Prior to Admission medications   Medication Sig Start Date End Date Taking? Authorizing Provider  acetaminophen (TYLENOL) 500 MG tablet Take 1,000 mg by mouth every 6 (six) hours as needed for moderate pain.   Yes [provider]  albuterol (PROVENTIL HFA;VENTOLIN HFA) 108 (90 BASE) MCG/ACT inhaler Inhale 2 puffs into the lungs every 6 (six) hours as needed for wheezing  or shortness of breath.   Yes [provider]  albuterol (PROVENTIL) (2.5 MG/3ML) 0.083% nebulizer solution Inhale 3 mLs into the lungs in the morning and at bedtime.    Yes [provider]  ALPRAZolam (XANAX) 0.25 MG tablet Take 0.5 tablets by mouth at bedtime.   Yes [provider]  amLODipine (NORVASC) 5 MG tablet Take 5 mg by mouth daily.   Yes [provider]  omeprazole (PRILOSEC) 20 MG capsule Take 20 mg by mouth daily.   Yes [provider]  diclofenac Sodium (VOLTAREN) 1 % GEL Apply 4 g topically 4 (four) times daily. Patient not taking: Reported on 12/19/2019 12/12/19   [provider]  ibuprofen (ADVIL) 400 MG tablet Take 400 mg by mouth every 6 (six) hours as needed. Patient not taking: Reported on 12/19/2019 12/07/19   [provider]    Allergies    Codeine and Morphine and related  Review of Systems   Review of Systems  Constitutional: Positive for fatigue. Negative for chills and fever.  Respiratory: Positive for cough and shortness of breath.   Cardiovascular: Negative for chest pain and leg swelling.  Gastrointestinal: Negative for abdominal pain, diarrhea, nausea and vomiting.  Genitourinary: Negative for difficulty urinating and dysuria.  Musculoskeletal: Negative for arthralgias, back pain and joint swelling.  Skin: Negative for color change and wound.  Neurological: Positive for weakness. Negative for dizziness, syncope, numbness and headaches.    Physical Exam Updated Vital Signs BP (!) 140/94 (BP Location: Left Arm)   Pulse (!) 103   Temp 98.1 F (36.7 C) (Oral)   Resp (!) 24   Ht 6' (1.829 m)   Wt 96.2 kg   SpO2 94%   BMI 28.75 kg/m   Physical Exam Vitals and nursing note reviewed.  Constitutional:      Appearance: He is not ill-appearing or toxic-appearing.  HENT:     Head: Normocephalic.     Mouth/Throat:     Mouth: Mucous membranes are moist.  Eyes:     Extraocular Movements:  Extraocular movements intact.     Pupils: Pupils are equal, round, and reactive to light.  Neck:     Vascular: No JVD.  Cardiovascular:     Rate and Rhythm: Normal rate and regular rhythm.  Pulmonary:     Breath sounds: Normal breath sounds.  Musculoskeletal:        General: Normal range of motion.     Right lower leg: No edema.     Left lower leg: No edema.  Skin:    General: Skin is warm.     Capillary Refill: Capillary refill takes less than 2 seconds.     Findings: No erythema or rash.  Neurological:     General: No focal deficit present.     Mental Status:  He is alert.     ED Results / Procedures / Treatments   Labs (all labs ordered are listed, but only abnormal results are displayed) Labs Reviewed  URINALYSIS, ROUTINE W REFLEX MICROSCOPIC - Abnormal; Notable for the following components:      Result Value   Ketones, ur 20 (*)    All other components within normal limits  BASIC METABOLIC PANEL  CBC WITH DIFFERENTIAL/PLATELET  BRAIN NATRIURETIC PEPTIDE  TROPONIN I (HIGH SENSITIVITY)  TROPONIN I (HIGH SENSITIVITY)    EKG EKG Interpretation  Date/Time: 12/19/19  Ventricular Rate: 91  PR Interval: 206  QRS Duration: 102  QT Interval: 347  QTC Calculation: 427 R Axis:    Text Interpretation: Sinus rhythm with borderline left axis deviation, abnormal R wave progression, early transition.  EKG reviewed by Dr. Wilson Singer.    Radiology CT Angio Chest PE W and/or Wo Contrast  Result Date: 12/19/2019 CLINICAL DATA:  Dyspnea, pneumonia, nonproductive cough, weakness EXAM: CT ANGIOGRAPHY CHEST WITH CONTRAST TECHNIQUE: Multidetector CT imaging of the chest was performed using the standard protocol during bolus administration of intravenous contrast. Multiplanar CT image reconstructions and MIPs were obtained to evaluate the vascular anatomy. CONTRAST:  141mL OMNIPAQUE IOHEXOL 350 MG/ML SOLN COMPARISON:  12/06/2019 FINDINGS: Cardiovascular: Satisfactory opacification of the  pulmonary arteries to the segmental level. No evidence of pulmonary embolism. Normal heart size. No pericardial effusion. The thoracic aorta is suboptimally opacified, but is unremarkable. Mediastinum/Nodes: No enlarged mediastinal, hilar, or axillary lymph nodes. Thyroid gland, trachea, and esophagus demonstrate no significant findings. Lungs/Pleura: Large right pleural effusion is again identified, similar in size to prior examination, with associated subtotal collapse of the right lower lobe. Mild ground-glass pulmonary infiltrate within the dependent left lower lobe is nonspecific, but could reflect trace pulmonary edema or inflammatory infiltrate. This appears new since prior examination. No pneumothorax. The central airways are widely patent. Upper Abdomen: Limited images of the upper abdomen are unremarkable. Musculoskeletal: Cervical fusion hardware is partially visualized. Degenerative changes are seen within the thoracic spine. No lytic or blastic bone lesions are seen. Review of the MIP images confirms the above findings. IMPRESSION: Large right pleural effusion, similar in size to prior examination, with associated subtotal collapse of the right lower lobe. Interval development of trace ground-glass pulmonary infiltrate within the left lower lobe, edema versus infection/inflammation. No pulmonary embolism Electronically Signed   By: Fidela Salisbury MD   On: 12/19/2019 15:59   DG Chest Portable 1 View  Result Date: 12/19/2019 CLINICAL DATA:  Shortness of breath with fatigue EXAM: PORTABLE CHEST 1 VIEW COMPARISON:  November 13, 2019 FINDINGS: There is a fairly small loculated right pleural effusion. There is a minimal left pleural effusion. There is atelectatic change in the lung bases. There is no consolidation. Heart size and pulmonary vascularity are normal. No adenopathy. There is postoperative change in the lower cervical region. IMPRESSION: Fairly small right sided loculated pleural effusion with  minimal left pleural effusion. Bibasilar atelectasis. Lungs otherwise clear. Stable cardiac silhouette. Electronically Signed   By: Lowella Grip III M.D.   On: 12/19/2019 14:03    Procedures Procedures (including critical care time)  Medications Ordered in ED Medications - No data to display  ED Course  I have reviewed the triage vital signs and the nursing notes.  Pertinent labs & imaging results that were available during my care of the patient were reviewed by me and considered in my medical decision making (see chart for details).    MDM Rules/Calculators/A&P  Patient here with gradually worsening fatigue, dyspnea and generalized weakness.  Symptoms temporarily improved after right thoracentesis 6 days ago.  2 days later, symptoms returned.  Here today for further evaluation.  No reported history of fever, chills, chest pain or productive cough.  Chest x-ray shows loculated pleural effusion on the right small in size.  We will proceed with CT angio of chest.  CTA of chest shows large right pleural effusion reported is similar in size to prior exam with subtotal collapse of right lung.  Interval development of trace groundglass pulmonary infiltrate of the left lower lobe without evidence for PE.  No acute ischemic EKG changes.  Doubt cardiac process.  Labs unremarkable. Will consult pulmonology/critical care   [place]  Dr. Elsworth Soho and discussed findings.  Pt will likely need pleural biopsy.   Discussed findings with pt, offered admission, but he prefers out patient f/u.  Dr. Elsworth Soho will message for office to contact pt to schedule f/u. Ambulated in the department without hypoxia or respiratory distress.    Final Clinical Impression(s) / ED Diagnoses Final diagnoses:  Pleural effusion, right    Rx / DC Orders ED Discharge Orders    None       Kem Parkinson, PA-C 12/21/19 1336    Fredia Sorrow, MD 12/22/19 [phone]

## 2019-12-20 NOTE — Telephone Encounter (Signed)
Ok to order thoracentesis on R with repeat cytology only  Order per IR same hospital as last time

## 2019-12-20 NOTE — Telephone Encounter (Signed)
Dr. Elsworth Soho and Dr. Melvyn Novas, please advise as pt's wife has called about having appt scheduled to have thora peformed. Pt is currently scheduled for a f/u with [facility] 7/[facility].

## 2019-12-20 NOTE — Telephone Encounter (Signed)
Pt's wife calling to get an appt for getting the fluid off of pt's lungs. Pt's wife can be reached at [phone].

## 2019-12-21 ENCOUNTER — Telehealth: Payer: Self-pay | Admitting: Internal Medicine

## 2019-12-21 NOTE — Telephone Encounter (Signed)
I have spoken with the pt's spouse and ordered thoracentesis  She is aware someone will call her to schedule

## 2019-12-21 NOTE — Telephone Encounter (Signed)
Pt's wife returning a phone call/ pt's wife can be reached at [phone]

## 2019-12-22 ENCOUNTER — Other Ambulatory Visit: Payer: Self-pay

## 2019-12-22 ENCOUNTER — Other Ambulatory Visit (HOSPITAL_COMMUNITY)
Admission: RE | Admit: 2019-12-22 | Discharge: 2019-12-22 | Disposition: A | Discharge status: Home / Self Care | Payer: Medicare Other | Source: Ambulatory Visit | Attending: Internal Medicine | Admitting: Internal Medicine

## 2019-12-22 DIAGNOSIS — Z20822 Contact with and (suspected) exposure to covid-19: Secondary | ICD-10-CM | POA: Insufficient documentation

## 2019-12-22 DIAGNOSIS — Z01818 Encounter for other preprocedural examination: Secondary | ICD-10-CM | POA: Diagnosis present

## 2019-12-22 LAB — SARS CORONAVIRUS 2 (TAT 6-24 HRS): SARS Coronavirus 2: NEGATIVE

## 2019-12-23 ENCOUNTER — Ambulatory Visit (HOSPITAL_COMMUNITY)
Admission: RE | Admit: 2019-12-23 | Discharge: 2019-12-23 | Disposition: A | Discharge status: Home / Self Care | Payer: Medicare Other | Source: Ambulatory Visit | Attending: Diagnostic Radiology | Admitting: Diagnostic Radiology

## 2019-12-23 ENCOUNTER — Encounter (HOSPITAL_COMMUNITY): Payer: Self-pay

## 2019-12-23 ENCOUNTER — Telehealth: Payer: Self-pay | Admitting: Internal Medicine

## 2019-12-23 ENCOUNTER — Other Ambulatory Visit (HOSPITAL_COMMUNITY): Payer: Medicare Other

## 2019-12-23 ENCOUNTER — Ambulatory Visit (HOSPITAL_COMMUNITY)
Admission: RE | Admit: 2019-12-23 | Discharge: 2019-12-23 | Disposition: A | Discharge status: Home / Self Care | Payer: Medicare Other | Source: Ambulatory Visit | Attending: Internal Medicine | Admitting: Internal Medicine

## 2019-12-23 DIAGNOSIS — J90 Pleural effusion, not elsewhere classified: Secondary | ICD-10-CM | POA: Insufficient documentation

## 2019-12-23 DIAGNOSIS — Z9889 Other specified postprocedural states: Secondary | ICD-10-CM | POA: Insufficient documentation

## 2019-12-23 NOTE — Telephone Encounter (Signed)
Spoke with the pt's spouse and notified of response per Dr Melvyn Novas She verbalized understanding  Nothing further needed

## 2019-12-23 NOTE — Telephone Encounter (Signed)
T surgery consult asap  Bloody R pl effusion

## 2019-12-23 NOTE — Procedures (Signed)
INDICATION: Right pleural effusion  EXAM: ULTRASOUND GUIDED RIGHT THORACENTESIS  GRIP-IR: Category: Fluids  Subcategory: Thoracentesis  Follow-Up: None  MEDICATIONS: None.  COMPLICATIONS: Vagal reaction during procedure.  PROCEDURE: An ultrasound guided thoracentesis was thoroughly discussed with the patient and questions answered.  The benefits, risks, alternatives and complications were also discussed.  The patient understands and wishes to proceed with the procedure.  Written consent was obtained.  Ultrasound was performed to localize and mark an adequate pocket of fluid in the right chest.  The area was then prepped and draped in the normal sterile fashion. 1% Lidocaine was used for local anesthesia.  Under ultrasound guidance a Yueh catheter was introduced. Thoracentesis was performed. The catheter was removed and a dressing applied.  During the thoracentesis, the patient experienced weakness along with bradycardia.  He appeared clammy and the overall appearance was compatible with a vagal reaction.  We placed the patient in the left lateral decubitus position and placed him Trendelenburg.  An IV was placed along with oxygen 2 L by nasal cannula.  Blood pressure and pulse stabilized normal and the procedure was completed.  A postprocedural chest radiograph demonstrated no pneumothorax.  The patient was sent to day surgery/short-stay for monitoring.   FINDINGS: A total of approximately 1500 cc of serosanguineous fluid was removed. Samples were sent to the laboratory as requested by the clinical team.  IMPRESSION: Successful ultrasound guided right thoracentesis yielding 1500 cc of pleural fluid.

## 2019-12-23 NOTE — Progress Notes (Signed)
Pt began to feel week and dizzy during thoracentesis. BP dropped as well as heart rate. Pt became obtunded and diaphoretic; placed onto left side in trendelenburg per MD. Placed on ECG monitor showing sinus brady at 50. IV started. Pt BP and heart rate began to improve and became less diaphoretic. Taken for post thoracentesis chest xray. Mental status improving. Pt then taken to short stay to be monitored and discharged.

## 2019-12-23 NOTE — Discharge Instructions (Signed)
Thoracentesis, Care After This sheet gives you information about how to care for yourself after your procedure. Your health care provider may also give you more specific instructions. If you have problems or questions, contact your health care provider. What can I expect after the procedure? After your procedure, it is common to have some pain at the site where the needle was inserted (puncture site). Follow these instructions at home:  Care of the puncture site  Follow instructions from your health care provider about how to take care of your puncture site. Make sure you: ? Wash your hands with soap and water before you change your bandage (dressing). If soap and water are not available, use hand sanitizer. ? Change your dressing as told by your health care provider.  Check the puncture site every day for signs of infection. Check for: ? Redness, swelling, or pain. ? Fluid or blood. ? Warmth. ? Pus or a bad smell.  Do not take baths, swim, or use a hot tub until your health care provider approves. General instructions  Take over-the-counter and prescription medicines only as told by your health care provider.  Do not drive for 24 hours if you were given a medicine to help you relax (sedative) during your procedure.  Drink enough fluid to keep your urine pale yellow.  You may return to your normal diet and normal activities as told by your health care provider.  Keep all follow-up visits as told by your health care provider. This is important. Contact a health care provider if you:  Have redness, swelling, or pain at your puncture site.  Have fluid or blood coming from your puncture site.  Notice that your puncture site feels warm to the touch.  Have pus or a bad smell coming from your puncture site.  Have a fever.  Have chills.  Have nausea or vomiting.  Have trouble breathing.  Develop a worsening cough. Get help right away if you:  Have extreme shortness of  breath.  Develop chest pain.  Faint or feel light-headed. Summary  After your procedure, it is common to have some pain at the site where the needle was inserted (puncture site).  Wash your hands with soap and water before you change your bandage (dressing).  Check your puncture site every day for signs of infection.  Take over-the-counter and prescription medicines only as told by your health care provider. This information is not intended to replace advice given to you by your health care provider. Make sure you discuss any questions you have with your health care provider. Document Revised: 05/08/2017 Document Reviewed: 04/20/2017 Elsevier Patient Education  2020 [employer].

## 2019-12-23 NOTE — Telephone Encounter (Signed)
Pt wife came into office stating that her husband is in the hospital he got fluid removed and needs a biospy she is very upset and feels like she is not getting any updates on what is going on and would like to speak with someone   (message copied from duplicate phone message also opened this afternoon.   Spoke with pt's wife (DPR on file), states that she wants to speak to Dr. Melvyn Novas about why he didn't order a biopsy with the thoracentesis performed today at 1100.  States she was told by [facility] that he would need a biopsy as well.  Pt's wife is upset that pt waited until today to have thoracentesis and was uncomfortable all week, thinking that they were waiting for a biopsy.    Wife Ryan Richards's callback #: [phone].  MW please advise when you've spoken to wife.  Thanks!

## 2019-12-23 NOTE — Telephone Encounter (Signed)
Error

## 2019-12-23 NOTE — Progress Notes (Signed)
Pt dressed, IV right wrist discontinued, bleeding controlled. Patient alert c/o some soreness on R side with movement. To car via wheelchair and discharged with wife. Patient's cane with wife in car

## 2019-12-23 NOTE — Progress Notes (Signed)
Call to wife via [phone] "Coralyn Mark" and patient spoke with his wife via portable phone. Advised wife that patient may be discharged home at 2:00pm today.  Patient resting quietly , lights dimmed to promote rest, side rails up with call bell at bedside.  Vital signs within baseline.

## 2019-12-23 NOTE — Progress Notes (Signed)
Patient received from radiology for observation s/p thorancentesis with some vagal response and diaphoresis. Breath sounds clea and equal. Bandaid right side of chest intact Patient alert on stretcher laying flat. Placed on cardiac monitor, continuous pulse ox, O2 2L [place]

## 2019-12-23 NOTE — Telephone Encounter (Signed)
Pt's wife calling back to state that she is very upset that the biopsy was not done this morning - states that she was told  In the ED that it would be done at the same time as the thoracentesis - because they needed fluid to do the biopsy -  Would like to know when the biopsy is going to be done or if it's necessary. Pt states that the Radiologist was just called this morning to come in to do the thoracentesis and she wants to know why he was just called today?

## 2019-12-23 NOTE — Progress Notes (Signed)
1:17pm Puncture site right posterior upper back intact.

## 2019-12-23 NOTE — Telephone Encounter (Signed)
Multiple encounters opened regarding the same issue.  Will close this duplicate encounter.

## 2019-12-26 ENCOUNTER — Ambulatory Visit (HOSPITAL_COMMUNITY): Payer: Medicare Other

## 2019-12-26 LAB — CYTOLOGY - NON PAP

## 2019-12-27 ENCOUNTER — Ambulatory Visit
Admission: RE | Admit: 2019-12-27 | Discharge: 2019-12-27 | Disposition: A | Discharge status: Home / Self Care | Payer: Medicare Other | Source: Ambulatory Visit | Attending: Cardiothoracic Surgery | Admitting: Cardiothoracic Surgery

## 2019-12-27 ENCOUNTER — Other Ambulatory Visit: Payer: Self-pay

## 2019-12-27 ENCOUNTER — Other Ambulatory Visit: Payer: Self-pay | Admitting: Cardiothoracic Surgery

## 2019-12-27 ENCOUNTER — Institutional Professional Consult (permissible substitution): Payer: Medicare Other | Admitting: Cardiothoracic Surgery

## 2019-12-27 ENCOUNTER — Other Ambulatory Visit: Payer: Self-pay | Admitting: *Deleted

## 2019-12-27 VITALS — BP 126/74 | HR 93 | Temp 97.7°F | Resp 20 | Ht 72.0 in | Wt 210.0 lb

## 2019-12-27 DIAGNOSIS — J90 Pleural effusion, not elsewhere classified: Secondary | ICD-10-CM

## 2019-12-27 NOTE — Progress Notes (Signed)
Spoke with pt's spouse and notified of results per Dr. Wert. She verbalized understanding and denied any questions. 

## 2019-12-27 NOTE — Progress Notes (Signed)
[address]       [place] [address]             [facility] Medical Record #[id number] Date of Birth: [birth year]  Referring: Tanda Rockers, MD Primary Care: Denny Levy, [place] Primary Cardiologist: Rozann Lesches, MD  Chief Complaint:    Chief Complaint  Patient presents with  . Pleural Effusion    Surgical eval with CXR today, CTA Chest 12/19/19, Thoracentesis 12/23/19    History of Present Illness:    Ryan Richards 79 y.o. male is seen in the office at the request of Dr. Melvyn Novas for recurrent right pleural effusion.  The patient and his wife note that over the past several months he has had increasing overall weakness, fatigue to the point over the past 3 weeks he has become dependent on a wheelchair.  Because of increasing shortness of breath he was seen in the emergency room on December 06, 2019 and again on December 19, 2019.  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  In addition he was noted to have moderately large pleural effusion on CT. thoracentesis was done July 6 with 450 mL of what was described as bloody effusion removed and again 1500 drained on July 16-cytology for malignancy with negative x2   Report notes cloudy red fluid with a total protein of 3.7 amylase of 29 glucose of 99 albumin  2.3-on thoracentesis dated July 7 no chemistries were sent on the fluid from the 16th, no bacteria were noted   Patient notes that both he and his wife developed COVID infections in November 2020, he notes that he had recovered well from this before the current episodes of increasing fatigue and weakness.   Patient has a history of multiple lipomas being resected from his arms and body over the past 20 years.  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 PLEURAL FLUID, RIGHT, THORACENTESIS:  FINAL MICROSCOPIC DIAGNOSIS:  - No malignant cells identified  SPECIMEN ADEQUACY:  Satisfactory for evaluation  DIAGNOSTIC COMMENTS:  Inflammation present.   Current Activity/ Functional Status:  Patient is independent with mobility/ambulation, transfers, ADL's, IADL's.   Zubrod Score: At the time of surgery this patient's most appropriate activity status/level should be described as: []     0    Normal activity, no symptoms []     1    Restricted in physical strenuous activity but ambulatory, able to do out light work []     2    Ambulatory and capable of self care, unable to do work activities, up and about               >50 % of waking hours                              [  x]    3    Only limited self care, in bed greater than 50% of waking hours []     4    Completely disabled, no self care, confined to bed or chair []     5    Moribund   Past Medical History:  Diagnosis Date  . Arthritis   . Bronchitis   . COPD (chronic obstructive pulmonary disease) ([place])   . Essential hypertension   . GERD (gastroesophageal reflux disease)   . Headache   . History of pneumonia 2015  . Prostate cancer ([place])   . Urinary frequency     Past Surgical History:  Procedure Laterality Date  . ANTERIOR CERVICAL DECOMP/DISCECTOMY FUSION N/A 03/20/2015   Procedure: C4-5 C5-6 C6-7 Anterior cervical decompression/diskectomy/fusion;  Surgeon: Earnie Larsson, MD;  Location: [facility];  Service: Neurosurgery;  Laterality: N/A;  C4-5 C5-6 C6-7  Anterior cervical decompression/diskectomy/fusion  . BACK SURGERY     x4  . CARDIAC CATHETERIZATION  1992   pt. states that everything was fine  . COLONOSCOPY  2012   Through [facility] in [place].  Marland Kitchen COLONOSCOPY N/A 01/13/2019   Procedure: COLONOSCOPY;  Surgeon: Rogene Houston, MD;  [facility]: [facility];  Service: Endoscopy;  Laterality: N/A;  730  . ESOPHAGOGASTRODUODENOSCOPY N/A 08/23/2015   Procedure: ESOPHAGOGASTRODUODENOSCOPY (EGD);  Surgeon: Rogene Houston, MD;  [facility];  Service: Endoscopy;  Laterality: N/A;  1:45  . KNEE SURGERY Left 1972  . POLYPECTOMY  01/13/2019   Procedure: POLYPECTOMY;  Surgeon: Rogene Houston, MD;  [facility];  Service: Endoscopy;;  sigmoid colon  . PROSTATE SURGERY     removal    Family History  Problem Relation Age of Onset  . Colon cancer Mother   . Arthritis Father   . Aneurysm Father   . Hiatal hernia Sister   . Hepatitis C Brother   . Healthy Sister   . Healthy Sister   . Hiatal hernia Sister   . Healthy Sister   . Healthy Sister   . Heart disease Maternal Aunt      Social History   Tobacco Use  Smoking Status Former Smoker  . Packs/day: 1.00  . Years: 40.00  . Pack years: 40.00  . Quit date: 06/09/1997  . Years since quitting: 22.5  Smokeless Tobacco Never Used    Social History   Substance and Sexual Activity  Alcohol Use No  . Alcohol/week: 0.0 standard drinks     Allergies  Allergen Reactions  . Codeine     Itch, and bad headache  . Morphine And Related     Itch, and bad headache     Current Outpatient Medications  Medication Sig Dispense Refill  . acetaminophen (TYLENOL) 500 MG tablet Take 1,000 mg by mouth every 6 (six) hours as needed for moderate pain.    Marland Kitchen albuterol (PROVENTIL HFA;VENTOLIN HFA) 108 (90 BASE) MCG/ACT inhaler Inhale 2 puffs into the lungs every 6 (six) hours as needed for wheezing or shortness of breath.    Marland Kitchen albuterol (PROVENTIL) (2.5 MG/3ML) 0.083% nebulizer solution  Inhale 3 mLs into the lungs in the morning and at bedtime.     . ALPRAZolam (XANAX) 0.25 MG tablet Take 0.5 tablets by mouth at bedtime.    Marland Kitchen amLODipine (NORVASC) 5 MG tablet Take 5 mg by mouth daily.    . cyanocobalamin 1000 MCG tablet Take 1,000 mcg by mouth daily. Takes 2 daily    . ibuprofen (ADVIL)  400 MG tablet Take 400 mg by mouth every 6 (six) hours as needed.     . Misc Natural Products (OSTEO BI-FLEX ADV TRIPLE ST PO) Take 2 capsules by mouth daily.    Marland Kitchen omeprazole (PRILOSEC) 20 MG capsule Take 20 mg by mouth daily.    Marland Kitchen PARoxetine (PAXIL) 10 MG tablet Take 10 mg by mouth daily. Started on 12/22/19     No current facility-administered medications for this visit.    Pertinent items are noted in HPI.   Review of Systems:     Cardiac Review of Systems: [Y] = yes  or   [ N ] = no   Chest Pain [  n  ]  Resting SOB [  y ] Exertional SOB  [ y ]  Orthopnea Blue.Reese  ]   Pedal Edema [ n  ]    Palpitations [n  ] Syncope  [ n ]   Presyncope [n   ]   General Review of Systems: [Y] = yes [  ]=no Constitional: recent weight change Blue.Reese  ];  Wt loss over the last 3 months [   ] anorexia [  ]; fatigue [ y ]; nausea [  ]; night sweats [  ]; fever [  ]; or chills [  ];           Eye : blurred vision [  ]; diplopia [   ]; vision changes [  ];  Amaurosis fugax[  ]; Resp: cough [  ];  wheezing[  ];  hemoptysis[  ]; shortness of breath[ y ]; paroxysmal nocturnal dyspnea[ y ]; dyspnea on exertion[y  ]; or orthopnea[  ];  GI:  gallstones[  ], vomiting[  ];  dysphagia[  ]; melena[  ];  hematochezia [  ]; heartburn[  ];   Hx of  Colonoscopy[  ]; GU: kidney stones [  ]; hematuria[  ];   dysuria [  ];  nocturia[  ];  history of     obstruction [  ]; urinary frequency [  ]             Skin: rash, swelling[  ];, hair loss[  ];  peripheral edema[  ];  or itching[  ]; Musculosketetal: myalgias[  ];  joint swelling[  ];  joint erythema[  ];  joint pain[  ];  back pain[  ];  Heme/Lymph: bruising[  ];  bleeding[  ];   anemia[  ];  Neuro: TIA[  ];  headaches[  ];  stroke[  ];  vertigo[  ];  seizures[  ];   paresthesias[  ];  difficulty walking[y  ];  Psych:depression[  ]; anxiety[  ];  Endocrine: diabetes[  ];  thyroid dysfunction[  ];  Immunizations: Flu up to date [ y ]; Pneumococcal up to date [ n ]; covid n infecte  Nov 2020  Other:     PHYSICAL EXAMINATION: BP 126/74 (BP Location: Right Arm)   Pulse 93   Temp 97.7 F (36.5 C) (Temporal)   Resp 20   Ht 6' (1.829 m)   Wt 210 lb (95.3 kg)   SpO2 92% Comment: RA  BMI 28.48 kg/m  General appearance: alert, cooperative, appears older than stated age, fatigued, mild distress and Patient comes into the office in a wheelchair as he notes that he is too fatigued to get around very well this is been over the past month Head: Normocephalic, without obvious abnormality, atraumatic Neck: no adenopathy, no carotid bruit,  no JVD, supple, symmetrical, trachea midline and thyroid not enlarged, symmetric, no tenderness/mass/nodules Lymph nodes: Cervical, supraclavicular, and axillary nodes normal. Resp: diminished breath sounds RLL Cardio: regular rate and rhythm, S1, S2 normal, no murmur, click, rub or gallop GI: soft, non-tender; bowel sounds normal; no masses,  no organomegaly Extremities: extremities normal, atraumatic, no cyanosis or edema Neurologic: Grossly normal  Diagnostic Studies & Laboratory data:     Recent Radiology Findings:    DG Chest 2 View  Result Date: 12/27/2019 CLINICAL DATA:  Shortness of breath, pleural effusion. EXAM: CHEST - 2 VIEW COMPARISON:  December 23, 2019. FINDINGS: Stable cardiomegaly. No pneumothorax is noted. Stable elevated left hemidiaphragm is noted. Left lung is clear. Small possibly loculated right pleural effusion is noted with associated right basilar atelectasis. Bony thorax is unremarkable. IMPRESSION: Small possibly loculated right pleural effusion is noted with associated right basilar atelectasis. Electronically  Signed   By: Marijo Conception M.D.   On: 12/27/2019 13:59    CT Angio Chest PE W and/or Wo Contrast  Result Date: 12/19/2019 CLINICAL DATA:  Dyspnea, pneumonia, nonproductive cough, weakness EXAM: CT ANGIOGRAPHY CHEST WITH CONTRAST TECHNIQUE: Multidetector CT imaging of the chest was performed using the standard protocol during bolus administration of intravenous contrast. Multiplanar CT image reconstructions and MIPs were obtained to evaluate the vascular anatomy. CONTRAST:  157mL OMNIPAQUE IOHEXOL 350 MG/ML SOLN COMPARISON:  12/06/2019 FINDINGS: Cardiovascular: Satisfactory opacification of the pulmonary arteries to the segmental level. No evidence of pulmonary embolism. Normal heart size. No pericardial effusion. The thoracic aorta is suboptimally opacified, but is unremarkable. Mediastinum/Nodes: No enlarged mediastinal, hilar, or axillary lymph nodes. Thyroid gland, trachea, and esophagus demonstrate no significant findings. Lungs/Pleura: Large right pleural effusion is again identified, similar in size to prior examination, with associated subtotal collapse of the right lower lobe. Mild ground-glass pulmonary infiltrate within the dependent left lower lobe is nonspecific, but could reflect trace pulmonary edema or inflammatory infiltrate. This appears new since prior examination. No pneumothorax. The central airways are widely patent. Upper Abdomen: Limited images of the upper abdomen are unremarkable. Musculoskeletal: Cervical fusion hardware is partially visualized. Degenerative changes are seen within the thoracic spine. No lytic or blastic bone lesions are seen. Review of the MIP images confirms the above findings. IMPRESSION: Large right pleural effusion, similar in size to prior examination, with associated subtotal collapse of the right lower lobe. Interval development of trace ground-glass pulmonary infiltrate within the left lower lobe, edema versus infection/inflammation. No pulmonary embolism  Electronically Signed   By: Fidela Salisbury MD   On: 12/19/2019 15:59   US THORACENTESIS ASP PLEURAL SPACE W/IMG GUIDE  Result Date: 12/23/2019 INDICATION: Right pleural effusion EXAM: ULTRASOUND GUIDED RIGHT THORACENTESIS MEDICATIONS: None. COMPLICATIONS: Vagal reaction. PROCEDURE: An ultrasound guided thoracentesis was thoroughly discussed with the patient and questions answered. The benefits, risks, alternatives and complications were also discussed. The patient understands and wishes to proceed with the procedure. Written consent was obtained. Ultrasound was performed to localize and mark an adequate pocket of fluid in the right chest. The area was then prepped and draped in the normal sterile fashion. 1% Lidocaine was used for local anesthesia. Under ultrasound guidance a Yueh catheter was introduced. Thoracentesis was performed. The catheter was removed and a dressing applied. During the thoracentesis, the patient experienced weakness along with bradycardia. He appeared clammy and the overall appearance was compatible with a vagal reaction. We placed the patient in the left lateral decubitus position and placed him in Trendelenburg. An  IV was placed along with oxygen 2 L by nasal cannula. Blood pressure and pulse stabilized to normal and the procedure was completed. A postprocedural chest radiograph demonstrated no pneumothorax. The patient was sent to day surgery/short-stay for monitoring. FINDINGS: A total of approximately 1500 cc of serosanguineous fluid was removed. Samples were sent to the laboratory as requested by the clinical team. IMPRESSION: Successful ultrasound guided right thoracentesis yielding 1500 cc of pleural fluid. The patient did experience a vagal reaction during the procedure, treated conservatively as noted above and monitored afterwards in short-stay. Electronically Signed   By: Van Clines M.D.   On: 12/23/2019 12:35   US THORACENTESIS ASP PLEURAL SPACE W/IMG  GUIDE  Result Date: 12/13/2019 INDICATION: RIGHT pleural effusion EXAM: ULTRASOUND GUIDED DIAGNOSTIC AND THERAPEUTIC RIGHT THORACENTESIS MEDICATIONS: None. COMPLICATIONS: None immediate. PROCEDURE: An ultrasound guided thoracentesis was thoroughly discussed with the patient and questions answered. The benefits, risks, alternatives and complications were also discussed. The patient understands and wishes to proceed with the procedure. Written consent was obtained. Ultrasound was performed to localize and mark an adequate pocket of fluid in the RIGHT chest. The area was then prepped and draped in the normal sterile fashion. 1% Lidocaine was used for local anesthesia. Under ultrasound guidance a 8 French thoracentesis catheter was introduced. Thoracentesis was performed. The catheter was removed and a dressing applied. FINDINGS: A total of approximately 450 mL of dark bloody fluid was removed. Samples were sent to the laboratory as requested by the clinical team. IMPRESSION: Successful ultrasound guided RIGHT thoracentesis yielding 450 mL in of pleural fluid. Electronically Signed   By: Lavonia Dana M.D.   On: 12/13/2019 11:11     I have independently reviewed the above radiology studies  and reviewed the findings with the patient.   Recent Lab Findings: Lab Results  Component Value Date   WBC 8.4 12/19/2019   HGB 15.8 12/19/2019   HCT 46.7 12/19/2019   PLT 396 12/19/2019   GLUCOSE 95 12/19/2019   ALT 18 02/22/2019   AST 22 02/22/2019   NA 137 12/19/2019   K 4.0 12/19/2019   CL 102 12/19/2019   CREATININE 0.89 12/19/2019   BUN 14 12/19/2019   CO2 24 12/19/2019   Echo: 11/22/2019 Left ventricular ejection fraction, by estimation, is 55 to 60%. The left ventricle has normal function. Left ventricular endocardial border not optimally defined to evaluate regional wall motion. Left ventricular diastolic parameters are consistent with Grade I diastolic dysfunction (impaired relaxation). 2. Right  ventricular systolic function is normal. The right ventricular size is normal. There is normal pulmonary artery systolic pressure. 3. The mitral valve is normal in structure. No evidence of mitral valve regurgitation. No evidence of mitral stenosis. 4. The aortic valve was not well visualized. Aortic valve regurgitation is mild. No aortic stenosis is present. 5. Aortic dilatation noted. There is mild dilatation of the aortic root measuring 43 mm. 6. The inferior vena cava is normal in size with greater than 50% respiratory variability, suggesting right atrial pressure of 3 mmHg.  Assessment / Plan:   #1 recurrent bloody right pleural effusion-tapped x2 over the past 10 days-initially cytology are negative for malignancy.  With the new onset of generalized weakness recurrent right pleural effusion without evidence of congestive heart failure by echo or history I discussed options with the patient.  Pleurx catheter could be placed for symptomatic relief but unlikely to add any more information to our current diagnosis as pleural cytologies have been negative twice.  Although more invasive I recommend to the patient that we proceed with bronchoscopy right robotic assisted thoracoscopy with drainage of residual fluid multiple pleural biopsies -although there is no significant pleural thickening noted on CT scan the patient has had previous asbestos exposure.  We also discuss talc pleurodesis with sterile powder talc at this time of intervention. Patient is agreeable with proceeding, plan Friday, July 23.  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    Grace Isaac MD      [address] [place] [address] Office  [phone]     12/27/2019 5:13 PM

## 2019-12-28 ENCOUNTER — Other Ambulatory Visit: Payer: Self-pay

## 2019-12-28 ENCOUNTER — Encounter (HOSPITAL_COMMUNITY): Payer: Self-pay

## 2019-12-28 ENCOUNTER — Other Ambulatory Visit (HOSPITAL_COMMUNITY)
Admission: RE | Admit: 2019-12-28 | Discharge: 2019-12-28 | Disposition: A | Discharge status: Home / Self Care | Payer: Medicare Other | Source: Ambulatory Visit | Attending: Cardiothoracic Surgery | Admitting: Cardiothoracic Surgery

## 2019-12-28 ENCOUNTER — Encounter (HOSPITAL_COMMUNITY)
Admission: RE | Admit: 2019-12-28 | Discharge: 2019-12-28 | Disposition: A | Discharge status: Home / Self Care | Payer: Medicare Other | Source: Ambulatory Visit | Attending: Cardiothoracic Surgery | Admitting: Cardiothoracic Surgery

## 2019-12-28 DIAGNOSIS — Z01812 Encounter for preprocedural laboratory examination: Secondary | ICD-10-CM | POA: Insufficient documentation

## 2019-12-28 DIAGNOSIS — J90 Pleural effusion, not elsewhere classified: Secondary | ICD-10-CM | POA: Insufficient documentation

## 2019-12-28 DIAGNOSIS — Z20822 Contact with and (suspected) exposure to covid-19: Secondary | ICD-10-CM | POA: Insufficient documentation

## 2019-12-28 HISTORY — DX: Dependence on other enabling machines and devices: Z99.89

## 2019-12-28 HISTORY — DX: Panic disorder (episodic paroxysmal anxiety): F41.0

## 2019-12-28 HISTORY — DX: Dyspnea, unspecified: R06.00

## 2019-12-28 HISTORY — DX: Presence of spectacles and contact lenses: Z97.3

## 2019-12-28 HISTORY — DX: Unspecified hearing loss, unspecified ear: H91.90

## 2019-12-28 LAB — BLOOD GAS, ARTERIAL
Acid-base deficit: 1.2 mmol/L (ref 0.0–2.0)
Bicarbonate: 22.6 mmol/L (ref 20.0–28.0)
Drawn by: 421801
FIO2: 21
O2 Saturation: 96.8 %
Patient temperature: 37
pCO2 arterial: 35.2 mmHg (ref 32.0–48.0)
pH, Arterial: 7.424 (ref 7.350–7.450)
pO2, Arterial: 86 mmHg (ref 83.0–108.0)

## 2019-12-28 LAB — COMPREHENSIVE METABOLIC PANEL
ALT: 14 U/L (ref 0–44)
AST: 15 U/L (ref 15–41)
Albumin: 3.2 g/dL — ABNORMAL LOW (ref 3.5–5.0)
Alkaline Phosphatase: 73 U/L (ref 38–126)
Anion gap: 12 (ref 5–15)
BUN: 11 mg/dL (ref 8–23)
CO2: 19 mmol/L — ABNORMAL LOW (ref 22–32)
Calcium: 9.2 mg/dL (ref 8.9–10.3)
Chloride: 101 mmol/L (ref 98–111)
Creatinine, Ser: 0.96 mg/dL (ref 0.61–1.24)
GFR calc Af Amer: >60 mL/min (ref >60)
GFR calc non Af Amer: >60 mL/min (ref >60)
Glucose, Bld: 106 mg/dL — ABNORMAL HIGH (ref 70–99)
Potassium: 4 mmol/L (ref 3.5–5.1)
Sodium: 132 mmol/L — ABNORMAL LOW (ref 135–145)
Total Bilirubin: 1 mg/dL (ref 0.3–1.2)
Total Protein: 7 g/dL (ref 6.5–8.1)

## 2019-12-28 LAB — CBC
HCT: 46.7 % (ref 39.0–52.0)
Hemoglobin: 15.5 g/dL (ref 13.0–17.0)
MCH: 31.1 pg (ref 26.0–34.0)
MCHC: 33.2 g/dL (ref 30.0–36.0)
MCV: 93.6 fL (ref 80.0–100.0)
Platelets: 466 10*3/uL — ABNORMAL HIGH (ref 150–400)
RBC: 4.99 MIL/uL (ref 4.22–5.81)
RDW: 11.7 % (ref 11.5–15.5)
WBC: 7.9 10*3/uL (ref 4.0–10.5)
nRBC: 0 % (ref 0.0–0.2)

## 2019-12-28 LAB — URINALYSIS, ROUTINE W REFLEX MICROSCOPIC
Bilirubin Urine: NEGATIVE
Glucose, UA: NEGATIVE mg/dL
Hgb urine dipstick: NEGATIVE
Ketones, ur: 5 mg/dL — AB
Leukocytes,Ua: NEGATIVE
Nitrite: NEGATIVE
Protein, ur: NEGATIVE mg/dL
Specific Gravity, Urine: 1.026 (ref 1.005–1.030)
pH: 5 (ref 5.0–8.0)

## 2019-12-28 LAB — TYPE AND SCREEN
ABO/RH(D): A POS
Antibody Screen: NEGATIVE

## 2019-12-28 LAB — PROTIME-INR
INR: 1.1 (ref 0.8–1.2)
Prothrombin Time: 13.7 seconds (ref 11.4–15.2)

## 2019-12-28 LAB — APTT: aPTT: 34 seconds (ref 24–36)

## 2019-12-28 LAB — SARS CORONAVIRUS 2 (TAT 6-24 HRS): SARS Coronavirus 2: NEGATIVE

## 2019-12-28 LAB — SURGICAL PCR SCREEN
MRSA, PCR: NEGATIVE
Staphylococcus aureus: NEGATIVE

## 2019-12-28 NOTE — Progress Notes (Signed)
Anesthesia Chart Review:  Pt recently developed worsening SOB and fatigue March/ April 2021. Because of increasing shortness of breath he was seen in the emergency room on December 06, 2019 and again on December 19, 2019.  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  In addition he was noted to have moderately large pleural effusion on CT. Thoracentesis was done July 6 with 450 mL of what was described as bloody effusion removed and again 1500 drained on July 16-cytology for malignancy with negative x2.  Preop labs reviewed, unremarkable.   EKG 12/19/19: Sinus rhythm. Rate 91. Borderline left axis deviation. Abnormal R-wave progression, early transition. No significant change since last tracing  CHEST - 2 VIEW 12/27/19:  COMPARISON:  December 23, 2019.  FINDINGS: Stable cardiomegaly. No pneumothorax is noted. Stable elevated left hemidiaphragm is noted. Left lung is clear. Small possibly loculated right pleural effusion is noted with associated right basilar atelectasis. Bony thorax is unremarkable.  IMPRESSION: Small possibly loculated right pleural effusion is noted with associated right basilar atelectasis.  Echo: 11/22/2019 Left ventricular ejection fraction, by estimation, is 55 to 60%. The left ventricle has normal function. Left ventricular endocardial border not optimally defined to evaluate regional wall motion. Left ventricular diastolic parameters are consistent with Grade I diastolic dysfunction (impaired relaxation). 2. Right ventricular systolic function is normal. The right ventricular size is normal. There is normal pulmonary artery systolic pressure. 3. The mitral valve is normal in structure. No evidence of mitral valve regurgitation. No evidence of mitral stenosis. 4. The aortic valve was not well visualized. Aortic valve regurgitation is mild. No aortic stenosis  is present. 5. Aortic dilatation noted. There is mild dilatation of the aortic root measuring 43 mm. 6. The inferior vena cava is normal in size with greater than 50% respiratory variability, suggesting right atrial pressure of 3 mmHg.  Event monitor 11/08/19: ZIO XT reviewed.  24 hours analyzed.  Predominant rhythm is sinus with heart rate ranging from 48 bpm up to 99 bpm and average heart rate 62 bpm.  Frequent PACs were noted representing 7% total beats.  There were occasional PVCs representing 1% total beats.  Frequent and relatively brief episodes of SVT were also noted, the longest of which was 7 beats.  There were no sustained arrhythmias or pauses.  Wynonia Musty [facility] Short Stay Center/Anesthesiology Phone [phone] 12/28/2019 3:42 PM

## 2019-12-28 NOTE — Progress Notes (Signed)
[place] [address] [place] [address] Phone: [phone] Fax: [phone]      Your procedure is scheduled on Friday, 12/30/19.  Report to [facility] Main Entrance "A" at 5:30 A.M., and check in at the Admitting office.  Call this number if you have problems the morning of surgery:  [phone]  Call [phone] if you have any questions prior to your surgery date Monday-Friday 8am-4pm    Remember:  Do not eat after midnight Thursday - the night before your surgery    Take these medicines the morning of surgery with A SIP OF WATER: amLODipine (NORVASC) omeprazole (PRILOSEC) PARoxetine (PAXIL) acetaminophen (TYLENOL) if needed  Ok to use Albuterol inhaler and Nebulizer treatment if needed.  Bring albuterol inhaler with you on day of surgery.   As of today, STOP taking any Aspirin (unless otherwise instructed by your surgeon) Aleve, Naproxen, Ibuprofen, Motrin, Advil, Goody's, BC's, all herbal medications, fish oil, and all vitamins.                      Do not wear jewelry.            Do not wear lotions, powders, colognes, or deodorant.            Men may shave face and neck.            Do not bring valuables to the hospital.            [facility] is not responsible for any belongings or valuables.  Do NOT Smoke (Tobacco/Vaping) or drink Alcohol 24 hours prior to your procedure If you use a CPAP at night, you may bring all equipment for your overnight stay.   Contacts, glasses, dentures or bridgework may not be worn into surgery.      For patients admitted to the hospital, discharge time will be determined by your treatment team.   Patients discharged the day of surgery will not be allowed to drive home, and someone needs to stay with them for 24 hours.    Special instructions:   [facility]- Preparing For Surgery  Before surgery, you can play an important role. Because skin is not sterile, your skin needs to be as  free of germs as possible. You can reduce the number of germs on your skin by washing with CHG (chlorahexidine gluconate) Soap before surgery.  CHG is an antiseptic cleaner which kills germs and bonds with the skin to continue killing germs even after washing.    Oral Hygiene is also important to reduce your risk of infection.  Remember - BRUSH YOUR TEETH THE MORNING OF SURGERY WITH YOUR REGULAR TOOTHPASTE  Please do not use if you have an allergy to CHG or antibacterial soaps. If your skin becomes reddened/irritated stop using the CHG.  Do not shave (including legs and underarms) for at least 48 hours prior to first CHG shower. It is OK to shave your face.  Please follow these instructions carefully.   1. Shower the NIGHT BEFORE SURGERY Thursday and the [facility] OF SURGERY Friday with CHG Soap.   2. If you chose to wash your hair, wash your hair first as usual with your normal shampoo.  3. After you shampoo, rinse your hair and body thoroughly to remove the shampoo.  4. Use CHG as you would any other liquid soap. You can apply CHG directly to the skin and wash gently with a  scrungie or a clean washcloth.   5. Apply the CHG Soap to your body ONLY FROM THE NECK DOWN.  Do not use on open wounds or open sores. Avoid contact with your eyes, ears, mouth and genitals (private parts). Wash Face and genitals (private parts)  with your normal soap.   6. Wash thoroughly, paying special attention to the area where your surgery will be performed.  7. Thoroughly rinse your body with warm water from the neck down.  8. DO NOT shower/wash with your normal soap after using and rinsing off the CHG Soap.  9. Pat yourself dry with a CLEAN TOWEL.  10. Wear CLEAN PAJAMAS to bed the night before surgery  11. Place CLEAN SHEETS on your bed the night of your first shower and DO NOT SLEEP WITH PETS.   Day of Surgery: Wear Clean/Comfortable clothing the morning of surgery Do not apply any deodorants/lotions.    Remember to brush your teeth WITH YOUR REGULAR TOOTHPASTE.   Please read over the following fact sheets that you were given.

## 2019-12-28 NOTE — Progress Notes (Signed)
Patient denies fever, cough or chest pain.  PCP - Denny Levy, PA Cardiologist - Dr Johnny Bridge Pulmonology - Dr Melvyn Novas  Chest x-ray - 12/27/19 EKG - 12/19/19 Stress Test - 10/19/09 ECHO - 11/22/19 Cardiac Cath - 1992  Anesthesia review: Yes  Coronavirus Screening Covid test on 12/28/19 at 11:45 am. Do you have any of the following symptoms:  Cough yes/no: No Fever (>100.83F)  yes/no: No Runny nose yes/no: No Sore throat yes/no: No  Have you traveled in the last 14 days and where? yes/no: No  Patient verbalized understanding of instructions that were given to them at the PAT appointment.

## 2019-12-28 NOTE — Anesthesia Preprocedure Evaluation (Addendum)
Anesthesia Evaluation  Patient identified by MRN, date of birth, ID band Patient awake    Reviewed: Allergy & Precautions, NPO status , Patient's Chart, lab work & pertinent test results  History of Anesthesia Complications Negative for: history of anesthetic complications  Airway Mallampati: III  TM Distance: >3 FB Neck ROM: Limited    Dental  (+) Dental Advisory Given   Pulmonary COPD,  COPD inhaler, former smoker,   Pleural effusion     + decreased breath sounds      Cardiovascular hypertension, Pt. on medications Normal cardiovascular exam+ dysrhythmias    '21 TTE - EF 55 to 60%. Grade I diastolic dysfunction (impaired relaxation). Mild AI. There is mild dilatation of the aortic root measuring 43 mm.     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   Neuromuscular disease    GI/Hepatic Neg liver ROS, GERD  Medicated and Controlled,  Endo/Other  negative endocrine ROS  Renal/GU negative Renal ROS     Musculoskeletal  (+) Arthritis ,   Abdominal   Peds  Hematology negative hematology ROS (+)   Anesthesia Other Findings Covid test negative   Reproductive/Obstetrics                           Anesthesia Physical Anesthesia Plan  ASA: III  Anesthesia Plan: General   Post-op Pain Management:    Induction: Intravenous  PONV Risk Score and Plan: 3 and Treatment may vary due to age or medical condition, Ondansetron and Dexamethasone  Airway Management Planned: Double Lumen EBT  Additional Equipment: Arterial line  Intra-op Plan:   Post-operative Plan: Extubation in OR  Informed Consent: I have reviewed the patients History and Physical, chart, labs and discussed the procedure including the risks, benefits and alternatives for the proposed anesthesia with the patient or authorized representative who has indicated his/her  understanding and acceptance.     Dental advisory given  Plan Discussed with: CRNA and Anesthesiologist  Anesthesia Plan Comments: (See PAT note )      Anesthesia Quick Evaluation

## 2019-12-30 ENCOUNTER — Other Ambulatory Visit: Payer: Self-pay

## 2019-12-30 ENCOUNTER — Encounter (HOSPITAL_COMMUNITY): Payer: Self-pay | Admitting: Cardiothoracic Surgery

## 2019-12-30 ENCOUNTER — Encounter (HOSPITAL_COMMUNITY)
Admission: RE | Disposition: A | Discharge status: Home / Self Care | Payer: Self-pay | Source: Home / Self Care | Attending: Cardiothoracic Surgery

## 2019-12-30 ENCOUNTER — Inpatient Hospital Stay (HOSPITAL_COMMUNITY): Payer: Medicare Other

## 2019-12-30 ENCOUNTER — Inpatient Hospital Stay (HOSPITAL_COMMUNITY)
Admission: RE | Admit: 2019-12-30 | Discharge: 2020-01-02 | DRG: 830 | Disposition: A | Discharge status: Home Health | Payer: Medicare Other | Attending: Cardiothoracic Surgery | Admitting: Cardiothoracic Surgery

## 2019-12-30 ENCOUNTER — Inpatient Hospital Stay (HOSPITAL_COMMUNITY): Payer: Medicare Other | Admitting: Registered Nurse

## 2019-12-30 ENCOUNTER — Inpatient Hospital Stay (HOSPITAL_COMMUNITY): Payer: Medicare Other | Admitting: Physician Assistant

## 2019-12-30 DIAGNOSIS — J910 Malignant pleural effusion: Secondary | ICD-10-CM | POA: Diagnosis present

## 2019-12-30 DIAGNOSIS — Z8546 Personal history of malignant neoplasm of prostate: Secondary | ICD-10-CM

## 2019-12-30 DIAGNOSIS — Z8616 Personal history of COVID-19: Secondary | ICD-10-CM | POA: Diagnosis not present

## 2019-12-30 DIAGNOSIS — Z800 Family history of malignant neoplasm of digestive organs: Secondary | ICD-10-CM | POA: Diagnosis not present

## 2019-12-30 DIAGNOSIS — J986 Disorders of diaphragm: Secondary | ICD-10-CM | POA: Diagnosis present

## 2019-12-30 DIAGNOSIS — Z09 Encounter for follow-up examination after completed treatment for conditions other than malignant neoplasm: Secondary | ICD-10-CM

## 2019-12-30 DIAGNOSIS — J90 Pleural effusion, not elsewhere classified: Secondary | ICD-10-CM

## 2019-12-30 DIAGNOSIS — K219 Gastro-esophageal reflux disease without esophagitis: Secondary | ICD-10-CM | POA: Diagnosis present

## 2019-12-30 DIAGNOSIS — Z8261 Family history of arthritis: Secondary | ICD-10-CM | POA: Diagnosis not present

## 2019-12-30 DIAGNOSIS — I10 Essential (primary) hypertension: Secondary | ICD-10-CM | POA: Diagnosis present

## 2019-12-30 DIAGNOSIS — Z87891 Personal history of nicotine dependence: Secondary | ICD-10-CM | POA: Diagnosis not present

## 2019-12-30 DIAGNOSIS — D649 Anemia, unspecified: Secondary | ICD-10-CM | POA: Diagnosis present

## 2019-12-30 DIAGNOSIS — C801 Malignant (primary) neoplasm, unspecified: Principal | ICD-10-CM | POA: Diagnosis present

## 2019-12-30 DIAGNOSIS — M4802 Spinal stenosis, cervical region: Secondary | ICD-10-CM | POA: Diagnosis present

## 2019-12-30 DIAGNOSIS — K2270 Barrett's esophagus without dysplasia: Secondary | ICD-10-CM | POA: Diagnosis present

## 2019-12-30 DIAGNOSIS — Z77090 Contact with and (suspected) exposure to asbestos: Secondary | ICD-10-CM | POA: Diagnosis present

## 2019-12-30 HISTORY — PX: XI ROBOTIC ASSISTED THORACOSCOPY PLEURECTOMY: SHX6888

## 2019-12-30 HISTORY — PX: CHEST TUBE INSERTION: SHX231

## 2019-12-30 HISTORY — PX: INTERCOSTAL NERVE BLOCK: SHX5021

## 2019-12-30 HISTORY — PX: VIDEO BRONCHOSCOPY: SHX5072

## 2019-12-30 HISTORY — PX: TALC PLEURODESIS: SHX2506

## 2019-12-30 LAB — GLUCOSE, PLEURAL OR PERITONEAL FLUID: Glucose, Fluid: 73 mg/dL

## 2019-12-30 LAB — PROTEIN, PLEURAL OR PERITONEAL FLUID: Total protein, fluid: <3 g/dL

## 2019-12-30 LAB — BODY FLUID CELL COUNT WITH DIFFERENTIAL
Lymphs, Fluid: 82 %
Monocyte-Macrophage-Serous Fluid: 4 % — ABNORMAL LOW (ref 50–90)
Neutrophil Count, Fluid: 14 % (ref 0–25)
Total Nucleated Cell Count, Fluid: 361 cu mm (ref 0–1000)

## 2019-12-30 LAB — LACTATE DEHYDROGENASE, PLEURAL OR PERITONEAL FLUID: LD, Fluid: 684 U/L — ABNORMAL HIGH (ref 3–23)

## 2019-12-30 SURGERY — DECORTICATION, LUNG, ROBOT-ASSISTED
Anesthesia: General | Site: Chest | Laterality: Right

## 2019-12-30 MED ORDER — ROCURONIUM BROMIDE 10 MG/ML (PF) SYRINGE
PREFILLED_SYRINGE | INTRAVENOUS | Status: DC | PRN
  Administered 2019-12-30: 30 mg via INTRAVENOUS
  Administered 2019-12-30: 50 mg via INTRAVENOUS
  Administered 2019-12-30: 70 mg via INTRAVENOUS

## 2019-12-30 MED ORDER — ONDANSETRON HCL 4 MG/2ML IJ SOLN
INTRAMUSCULAR | Status: AC
  Filled 2019-12-30: qty 2

## 2019-12-30 MED ORDER — SODIUM CHLORIDE FLUSH 0.9 % IV SOLN
INTRAVENOUS | Status: DC | PRN
  Administered 2019-12-30: 100 mL

## 2019-12-30 MED ORDER — NALOXONE HCL 0.4 MG/ML IJ SOLN: 0.4000 mg | INTRAMUSCULAR | Status: DC | PRN

## 2019-12-30 MED ORDER — ONDANSETRON HCL 4 MG/2ML IJ SOLN
INTRAMUSCULAR | Status: DC | PRN
  Administered 2019-12-30: 4 mg via INTRAVENOUS

## 2019-12-30 MED ORDER — ENOXAPARIN SODIUM 40 MG/0.4ML ~~LOC~~ SOLN
40.0000 mg | Freq: Every day | SUBCUTANEOUS | Status: DC
  Administered 2019-12-30 – 2020-01-01 (×3): 40 mg via SUBCUTANEOUS
  Filled 2019-12-30 (×3): qty 0.4

## 2019-12-30 MED ORDER — ONDANSETRON HCL 4 MG/2ML IJ SOLN: 4.0000 mg | Freq: Four times a day (QID) | INTRAMUSCULAR | Status: DC | PRN

## 2019-12-30 MED ORDER — LACTATED RINGERS IV SOLN: INTRAVENOUS | Status: DC | PRN

## 2019-12-30 MED ORDER — SODIUM CHLORIDE 0.9 % IR SOLN
Status: DC | PRN
  Administered 2019-12-30: 1000 mL

## 2019-12-30 MED ORDER — FENTANYL CITRATE (PF) 250 MCG/5ML IJ SOLN
INTRAMUSCULAR | Status: DC | PRN
  Administered 2019-12-30 (×5): 50 ug via INTRAVENOUS

## 2019-12-30 MED ORDER — DIPHENHYDRAMINE HCL 12.5 MG/5ML PO ELIX
12.5000 mg | ORAL_SOLUTION | Freq: Four times a day (QID) | ORAL | Status: DC | PRN
Start: 2019-12-30 — End: 2019-12-30

## 2019-12-30 MED ORDER — ORAL CARE MOUTH RINSE: 15.0000 mL | Freq: Once | OROMUCOSAL | Status: AC

## 2019-12-30 MED ORDER — DEXAMETHASONE SODIUM PHOSPHATE 10 MG/ML IJ SOLN
INTRAMUSCULAR | Status: AC
  Filled 2019-12-30: qty 1

## 2019-12-30 MED ORDER — MIDAZOLAM HCL 2 MG/2ML IJ SOLN
INTRAMUSCULAR | Status: DC | PRN
  Administered 2019-12-30: 2 mg via INTRAVENOUS

## 2019-12-30 MED ORDER — TALC 5 G PL SUSR
INTRAPLEURAL | Status: DC | PRN
  Administered 2019-12-30: 8 g via INTRAPLEURAL

## 2019-12-30 MED ORDER — HYDROMORPHONE 1 MG/ML IV SOLN
INTRAVENOUS | Status: DC
  Administered 2019-12-30: 30 mg via INTRAVENOUS

## 2019-12-30 MED ORDER — ALBUTEROL SULFATE HFA 108 (90 BASE) MCG/ACT IN AERS: 2.0000 | INHALATION_SPRAY | Freq: Four times a day (QID) | RESPIRATORY_TRACT | Status: DC | PRN

## 2019-12-30 MED ORDER — FENTANYL CITRATE (PF) 100 MCG/2ML IJ SOLN: 25.0000 ug | INTRAMUSCULAR | Status: DC | PRN

## 2019-12-30 MED ORDER — AMLODIPINE BESYLATE 5 MG PO TABS: 5.0000 mg | ORAL_TABLET | Freq: Every day | ORAL | Status: DC

## 2019-12-30 MED ORDER — DEXAMETHASONE SODIUM PHOSPHATE 10 MG/ML IJ SOLN
INTRAMUSCULAR | Status: DC | PRN
  Administered 2019-12-30: 8 mg via INTRAVENOUS

## 2019-12-30 MED ORDER — TALC (STERITALC) POWDER FOR INTRAPLEURAL USE
INTRAPLEURAL | Status: AC
  Filled 2019-12-30: qty 4

## 2019-12-30 MED ORDER — LIDOCAINE 2% (20 MG/ML) 5 ML SYRINGE
INTRAMUSCULAR | Status: DC | PRN
  Administered 2019-12-30: 80 mg via INTRAVENOUS

## 2019-12-30 MED ORDER — PAROXETINE HCL 10 MG PO TABS: 10.0000 mg | ORAL_TABLET | Freq: Every day | ORAL | Status: DC

## 2019-12-30 MED ORDER — BUPIVACAINE HCL (PF) 0.5 % IJ SOLN
INTRAMUSCULAR | Status: AC
  Filled 2019-12-30: qty 30

## 2019-12-30 MED ORDER — CHLORHEXIDINE GLUCONATE CLOTH 2 % EX PADS
6.0000 | MEDICATED_PAD | Freq: Every day | CUTANEOUS | Status: DC
  Administered 2019-12-30 – 2020-01-01 (×3): 6 via TOPICAL

## 2019-12-30 MED ORDER — LACTATED RINGERS IV SOLN: INTRAVENOUS | Status: DC

## 2019-12-30 MED ORDER — HEMOSTATIC AGENTS (NO CHARGE) OPTIME
TOPICAL | Status: DC | PRN
  Administered 2019-12-30: 3

## 2019-12-30 MED ORDER — HYDROMORPHONE 1 MG/ML IV SOLN
INTRAVENOUS | Status: AC
  Filled 2019-12-30: qty 30

## 2019-12-30 MED ORDER — SODIUM CHLORIDE 0.9 % IV SOLN: INTRAVENOUS | Status: DC | PRN

## 2019-12-30 MED ORDER — PROPOFOL 10 MG/ML IV BOLUS
INTRAVENOUS | Status: AC
  Filled 2019-12-30: qty 40

## 2019-12-30 MED ORDER — ROCURONIUM BROMIDE 10 MG/ML (PF) SYRINGE
PREFILLED_SYRINGE | INTRAVENOUS | Status: AC
  Filled 2019-12-30: qty 10

## 2019-12-30 MED ORDER — SENNOSIDES-DOCUSATE SODIUM 8.6-50 MG PO TABS
1.0000 | ORAL_TABLET | Freq: Every day | ORAL | Status: DC
  Administered 2019-12-30 – 2020-01-01 (×3): 1 via ORAL
  Filled 2019-12-30 (×3): qty 1

## 2019-12-30 MED ORDER — CEFAZOLIN SODIUM-DEXTROSE 2-4 GM/100ML-% IV SOLN
2.0000 g | Freq: Three times a day (TID) | INTRAVENOUS | Status: AC
  Administered 2019-12-30 (×2): 2 g via INTRAVENOUS
  Filled 2019-12-30 (×2): qty 100

## 2019-12-30 MED ORDER — FENTANYL CITRATE (PF) 250 MCG/5ML IJ SOLN
INTRAMUSCULAR | Status: AC
  Filled 2019-12-30: qty 5

## 2019-12-30 MED ORDER — ACETAMINOPHEN 500 MG PO TABS
1000.0000 mg | ORAL_TABLET | Freq: Four times a day (QID) | ORAL | Status: DC
  Administered 2019-12-30 – 2020-01-02 (×6): 1000 mg via ORAL
  Filled 2019-12-30 (×7): qty 2

## 2019-12-30 MED ORDER — CHLORHEXIDINE GLUCONATE 0.12 % MT SOLN
OROMUCOSAL | Status: AC
  Administered 2019-12-30: 15 mL via OROMUCOSAL
  Filled 2019-12-30: qty 15

## 2019-12-30 MED ORDER — BISACODYL 5 MG PO TBEC
10.0000 mg | DELAYED_RELEASE_TABLET | Freq: Every day | ORAL | Status: DC
  Administered 2019-12-31 – 2020-01-01 (×2): 10 mg via ORAL
  Filled 2019-12-30 (×2): qty 2

## 2019-12-30 MED ORDER — ACETAMINOPHEN 160 MG/5ML PO SOLN: 1000.0000 mg | Freq: Four times a day (QID) | ORAL | Status: DC

## 2019-12-30 MED ORDER — ONDANSETRON HCL 4 MG/2ML IJ SOLN: 4.0000 mg | Freq: Once | INTRAMUSCULAR | Status: DC | PRN

## 2019-12-30 MED ORDER — SUGAMMADEX SODIUM 200 MG/2ML IV SOLN
INTRAVENOUS | Status: DC | PRN
  Administered 2019-12-30: 200 mg via INTRAVENOUS

## 2019-12-30 MED ORDER — MIDAZOLAM HCL 2 MG/2ML IJ SOLN
INTRAMUSCULAR | Status: AC
  Filled 2019-12-30: qty 2

## 2019-12-30 MED ORDER — TRAMADOL HCL 50 MG PO TABS
50.0000 mg | ORAL_TABLET | Freq: Four times a day (QID) | ORAL | Status: DC | PRN
  Administered 2020-01-02: 50 mg via ORAL
  Filled 2019-12-30: qty 1

## 2019-12-30 MED ORDER — OXYCODONE HCL 5 MG PO TABS: 5.0000 mg | ORAL_TABLET | ORAL | Status: DC | PRN

## 2019-12-30 MED ORDER — LIDOCAINE 2% (20 MG/ML) 5 ML SYRINGE
INTRAMUSCULAR | Status: AC
  Filled 2019-12-30: qty 5

## 2019-12-30 MED ORDER — SODIUM CHLORIDE 0.9 % IV SOLN
INTRAVENOUS | Status: DC
  Administered 2019-12-30: 1000 mL via INTRAVENOUS

## 2019-12-30 MED ORDER — ALPRAZOLAM 0.25 MG PO TABS: 0.1250 mg | ORAL_TABLET | Freq: Every day | ORAL | Status: DC

## 2019-12-30 MED ORDER — CEFAZOLIN SODIUM-DEXTROSE 2-4 GM/100ML-% IV SOLN
2.0000 g | INTRAVENOUS | Status: AC
  Administered 2019-12-30: 2 g via INTRAVENOUS
  Filled 2019-12-30: qty 100

## 2019-12-30 MED ORDER — 0.9 % SODIUM CHLORIDE (POUR BTL) OPTIME
TOPICAL | Status: DC | PRN
  Administered 2019-12-30: 3000 mL

## 2019-12-30 MED ORDER — SODIUM CHLORIDE 0.9% FLUSH: 9.0000 mL | INTRAVENOUS | Status: DC | PRN

## 2019-12-30 MED ORDER — OXYCODONE HCL 5 MG PO TABS: 5.0000 mg | ORAL_TABLET | Freq: Once | ORAL | Status: DC | PRN

## 2019-12-30 MED ORDER — DIPHENHYDRAMINE HCL 50 MG/ML IJ SOLN: 12.5000 mg | Freq: Four times a day (QID) | INTRAMUSCULAR | Status: DC | PRN

## 2019-12-30 MED ORDER — OXYCODONE HCL 5 MG/5ML PO SOLN: 5.0000 mg | Freq: Once | ORAL | Status: DC | PRN

## 2019-12-30 MED ORDER — BUPIVACAINE LIPOSOME 1.3 % IJ SUSP
20.0000 mL | Freq: Once | INTRAMUSCULAR | Status: DC
  Filled 2019-12-30: qty 20

## 2019-12-30 MED ORDER — PROPOFOL 10 MG/ML IV BOLUS
INTRAVENOUS | Status: DC | PRN
  Administered 2019-12-30 (×2): 50 mg via INTRAVENOUS
  Administered 2019-12-30: 100 mg via INTRAVENOUS

## 2019-12-30 MED ORDER — ALPRAZOLAM 0.25 MG PO TABS
0.1250 mg | ORAL_TABLET | Freq: Two times a day (BID) | ORAL | Status: DC | PRN
  Administered 2019-12-30 – 2020-01-02 (×3): 0.125 mg via ORAL
  Filled 2019-12-30 (×3): qty 1

## 2019-12-30 MED ORDER — CHLORHEXIDINE GLUCONATE 0.12 % MT SOLN: 15.0000 mL | Freq: Once | OROMUCOSAL | Status: AC

## 2019-12-30 SURGICAL SUPPLY — 130 items
ADAPTER VALVE BIOPSY EBUS (MISCELLANEOUS) IMPLANT
ADH SKN CLS APL DERMABOND .7 (GAUZE/BANDAGES/DRESSINGS) ×2
ADPTR VALVE BIOPSY EBUS (MISCELLANEOUS) ×3
ANCHOR CATH FOLEY SECURE (MISCELLANEOUS) ×3 IMPLANT
APL SRG 22X2 LUM MLBL SLNT (VASCULAR PRODUCTS)
APPLICATOR TIP EXT COSEAL (VASCULAR PRODUCTS) IMPLANT
BAG SPEC RTRVL 10 TROC 200 (ENDOMECHANICALS)
BLADE CLIPPER SURG (BLADE) ×3 IMPLANT
BLADE SURG 11 STRL SS (BLADE) ×3 IMPLANT
BNDG COHESIVE 6X5 TAN STRL LF (GAUZE/BANDAGES/DRESSINGS) ×3 IMPLANT
BRUSH CYTOL CELLEBRITY 1.5X140 (MISCELLANEOUS) ×1 IMPLANT
BRUSH SCRUB EZ PLAIN DRY (MISCELLANEOUS) ×6 IMPLANT
CANISTER SUCT 3000ML PPV (MISCELLANEOUS) ×6 IMPLANT
CANNULA REDUC XI 12-8 STAPL (CANNULA)
CANNULA REDUCER 12-8 DVNC XI (CANNULA) IMPLANT
CATH ROBINSON RED A/P 18FR (CATHETERS) ×1 IMPLANT
CATH THORACIC 28FR (CATHETERS) IMPLANT
CATH THORACIC 36FR (CATHETERS) IMPLANT
CATH THORACIC 36FR RT ANG (CATHETERS) IMPLANT
CLEANER TIP ELECTROSURG 2X2 (MISCELLANEOUS) ×3 IMPLANT
CLIP VESOCCLUDE MED 6/CT (CLIP) ×3 IMPLANT
CNTNR URN SCR LID CUP LEK RST (MISCELLANEOUS) ×6 IMPLANT
CONN ST 1/4X3/8  BEN (MISCELLANEOUS) ×3
CONN ST 1/4X3/8 BEN (MISCELLANEOUS) IMPLANT
CONT SPEC 4OZ STRL OR WHT (MISCELLANEOUS) ×9
COVER BACK TABLE 60X90IN (DRAPES) ×3 IMPLANT
COVER SURGICAL LIGHT HANDLE (MISCELLANEOUS) ×3 IMPLANT
COVER TRANSDUCER ULTRASND GEL (DISPOSABLE) ×3 IMPLANT
DEFOGGER SCOPE WARMER CLEARIFY (MISCELLANEOUS) ×3 IMPLANT
DERMABOND ADVANCED (GAUZE/BANDAGES/DRESSINGS) ×1
DERMABOND ADVANCED .7 DNX12 (GAUZE/BANDAGES/DRESSINGS) ×2 IMPLANT
DISSECTOR BLUNT TIP ENDO 5MM (MISCELLANEOUS) IMPLANT
DRAIN CHANNEL 28F RND 3/8 FF (WOUND CARE) IMPLANT
DRAPE ARM DVNC X/XI (DISPOSABLE) ×8 IMPLANT
DRAPE C-ARM 42X72 X-RAY (DRAPES) ×3 IMPLANT
DRAPE COLUMN DVNC XI (DISPOSABLE) ×2 IMPLANT
DRAPE DA VINCI XI ARM (DISPOSABLE) ×12
DRAPE DA VINCI XI COLUMN (DISPOSABLE) ×3
DRAPE LAPAROSCOPIC ABDOMINAL (DRAPES) ×3 IMPLANT
DRAPE WARM FLUID 44X44 (DRAPES) IMPLANT
DRSG TEGADERM 4X10 (GAUZE/BANDAGES/DRESSINGS) ×1 IMPLANT
ELECT BLADE 4.0 EZ CLEAN MEGAD (MISCELLANEOUS) ×3
ELECT REM PT RETURN 9FT ADLT (ELECTROSURGICAL) ×3
ELECTRODE BLDE 4.0 EZ CLN MEGD (MISCELLANEOUS) IMPLANT
ELECTRODE REM PT RTRN 9FT ADLT (ELECTROSURGICAL) ×2 IMPLANT
FILTER SMOKE EVAC ULPA (FILTER) ×3 IMPLANT
FORCEPS BIOP RJ4 1.8 (CUTTING FORCEPS) IMPLANT
GAUZE KITTNER 4X5 RF (MISCELLANEOUS) ×1 IMPLANT
GAUZE KITTNER 4X8 (MISCELLANEOUS) ×3 IMPLANT
GAUZE SPONGE 4X4 12PLY STRL (GAUZE/BANDAGES/DRESSINGS) ×1 IMPLANT
GLOVE BIO SURGEON STRL SZ 6.5 (GLOVE) ×7 IMPLANT
GLOVE SURG SS PI 7.5 STRL IVOR (GLOVE) ×1 IMPLANT
GOWN STRL REUS W/ TWL LRG LVL3 (GOWN DISPOSABLE) ×6 IMPLANT
GOWN STRL REUS W/TWL 2XL LVL3 (GOWN DISPOSABLE) ×3 IMPLANT
GOWN STRL REUS W/TWL LRG LVL3 (GOWN DISPOSABLE) ×9
HEMOSTAT SURGICEL 2X14 (HEMOSTASIS) ×3 IMPLANT
IRRIGATION STRYKERFLOW (MISCELLANEOUS) ×2 IMPLANT
IRRIGATOR STRYKERFLOW (MISCELLANEOUS) ×3
KIT BASIN OR (CUSTOM PROCEDURE TRAY) ×3 IMPLANT
KIT CLEAN ENDO COMPLIANCE (KITS) ×3 IMPLANT
KIT PLEURX DRAIN CATH 1000ML (MISCELLANEOUS) ×3 IMPLANT
KIT PLEURX DRAIN CATH 15.5FR (DRAIN) ×4 IMPLANT
KIT TURNOVER KIT B (KITS) ×3 IMPLANT
MARKER SKIN DUAL TIP RULER LAB (MISCELLANEOUS) IMPLANT
NDL SPNL 18GX3.5 QUINCKE PK (NEEDLE) ×2 IMPLANT
NEEDLE SPNL 18GX3.5 QUINCKE PK (NEEDLE) ×3 IMPLANT
NS IRRIG 1000ML POUR BTL (IV SOLUTION) ×3 IMPLANT
OIL SILICONE PENTAX (PARTS (SERVICE/REPAIRS)) ×3 IMPLANT
PACK CHEST (CUSTOM PROCEDURE TRAY) ×3 IMPLANT
PACK GENERAL/GYN (CUSTOM PROCEDURE TRAY) ×3 IMPLANT
PAD ARMBOARD 7.5X6 YLW CONV (MISCELLANEOUS) ×15 IMPLANT
PASSER SUT SWANSON 36MM LOOP (INSTRUMENTS) IMPLANT
PENCIL SMOKE EVACUATOR (MISCELLANEOUS) ×3 IMPLANT
POUCH RETRIEVAL ECOSAC 10 (ENDOMECHANICALS) IMPLANT
POUCH RETRIEVAL ECOSAC 10MM (ENDOMECHANICALS)
SEAL CANN UNIV 5-8 DVNC XI (MISCELLANEOUS) ×6 IMPLANT
SEAL XI 5MM-8MM UNIVERSAL (MISCELLANEOUS) ×9
SEALANT PROGEL (MISCELLANEOUS) IMPLANT
SEALANT SURG COSEAL 4ML (VASCULAR PRODUCTS) IMPLANT
SEALANT SURG COSEAL 8ML (VASCULAR PRODUCTS) IMPLANT
SEALER LIGASURE MARYLAND 30 (ELECTROSURGICAL) IMPLANT
SET DRAINAGE LINE (MISCELLANEOUS) IMPLANT
SET TUBE SMOKE EVAC HIGH FLOW (TUBING) ×3 IMPLANT
SLEEVE SUCTION 125 (MISCELLANEOUS) ×3 IMPLANT
SOLUTION ELECTROLUBE (MISCELLANEOUS) IMPLANT
STAPLER CANNULA SEAL DVNC XI (STAPLE) IMPLANT
STAPLER CANNULA SEAL XI (STAPLE)
STOPCOCK 4 WAY LG BORE MALE ST (IV SETS) ×3 IMPLANT
SUT ETHILON 3 0 FSL (SUTURE) ×2 IMPLANT
SUT ETHILON 3 0 PS 1 (SUTURE) ×1 IMPLANT
SUT PROLENE 3 0 SH DA (SUTURE) IMPLANT
SUT PROLENE 4 0 RB 1 (SUTURE)
SUT PROLENE 4-0 RB1 .5 CRCL 36 (SUTURE) IMPLANT
SUT SILK  1 MH (SUTURE) ×6
SUT SILK 1 MH (SUTURE) ×4 IMPLANT
SUT SILK 1 TIES 10X30 (SUTURE) IMPLANT
SUT SILK 2 0SH CR/8 30 (SUTURE) IMPLANT
SUT VIC AB 1 CTX 18 (SUTURE) IMPLANT
SUT VIC AB 1 CTX 36 (SUTURE)
SUT VIC AB 1 CTX36XBRD ANBCTR (SUTURE) IMPLANT
SUT VIC AB 2-0 CTX 36 (SUTURE) IMPLANT
SUT VIC AB 3-0 X1 27 (SUTURE) ×6 IMPLANT
SUT VICRYL 0 TIES 12 18 (SUTURE) ×3 IMPLANT
SUT VICRYL 0 UR6 27IN ABS (SUTURE) ×6 IMPLANT
SUT VICRYL 2 TP 1 (SUTURE) IMPLANT
SYR 10ML LL (SYRINGE) ×3 IMPLANT
SYR 20ML ECCENTRIC (SYRINGE) ×6 IMPLANT
SYR 20ML LL LF (SYRINGE) ×3 IMPLANT
SYR 50ML LL SCALE MARK (SYRINGE) ×3 IMPLANT
SYR CONTROL 10ML LL (SYRINGE) ×3 IMPLANT
SYSTEM SAHARA CHEST DRAIN ATS (WOUND CARE) ×3 IMPLANT
TAPE CLOTH 4X10 WHT NS (GAUZE/BANDAGES/DRESSINGS) ×3 IMPLANT
TAPE CLOTH SURG 4X10 WHT LF (GAUZE/BANDAGES/DRESSINGS) ×1 IMPLANT
TAPE UMBILICAL COTTON 1/8X30 (MISCELLANEOUS) IMPLANT
TIP APPLICATOR SPRAY EXTEND 16 (VASCULAR PRODUCTS) IMPLANT
TOWEL GREEN STERILE (TOWEL DISPOSABLE) ×3 IMPLANT
TOWEL GREEN STERILE FF (TOWEL DISPOSABLE) ×3 IMPLANT
TRAP FLUID SMOKE EVACUATOR (MISCELLANEOUS) ×2 IMPLANT
TRAP SPECIMEN MUCUS 40CC (MISCELLANEOUS) ×1 IMPLANT
TRAY FOLEY MTR SLVR 16FR STAT (SET/KITS/TRAYS/PACK) ×3 IMPLANT
TRAY WAYNE PNEUMOTHORAX 14X18 (TRAY / TRAY PROCEDURE) ×3 IMPLANT
TROCAR XCEL 12X100 BLDLESS (ENDOMECHANICALS) ×3 IMPLANT
TROCAR XCEL BLADELESS 5X75MML (TROCAR) IMPLANT
TUBE CONNECTING 20X1/4 (TUBING) ×3 IMPLANT
TUBING EXTENTION W/L.L. (IV SETS) ×3 IMPLANT
VALVE BIOPSY  SINGLE USE (MISCELLANEOUS) ×3
VALVE BIOPSY SINGLE USE (MISCELLANEOUS) ×2 IMPLANT
VALVE REPLACEMENT CAP (MISCELLANEOUS) IMPLANT
VALVE SUCTION BRONCHIO DISP (MISCELLANEOUS) ×3 IMPLANT
WATER STERILE IRR 1000ML POUR (IV SOLUTION) ×3 IMPLANT

## 2019-12-30 NOTE — Progress Notes (Signed)
Md notified per pt's request for Xanax to help sleep. New orders received. Will continue to monitor Sa[facility] Revel T

## 2019-12-30 NOTE — Anesthesia Procedure Notes (Signed)
Procedure Name: Intubation Date/Time: 12/30/2019 7:56 AM Performed by: Kathryne Hitch, CRNA Pre-anesthesia Checklist: Patient identified, Emergency Drugs available, Suction available and Patient being monitored Patient Re-evaluated:Patient Re-evaluated prior to induction Oxygen Delivery Method: Circle system utilized Preoxygenation: Pre-oxygenation with 100% oxygen Induction Type: IV induction Ventilation: Mask ventilation without difficulty Laryngoscope Size: Mac and 4 Grade View: Grade I Tube type: Oral Endobronchial tube: Left, EBT position confirmed by auscultation, Double lumen EBT and EBT position confirmed by fiberoptic bronchoscope and 39 Fr Number of attempts: 1 Airway Equipment and Method: Stylet and Oral airway Placement Confirmation: ETT inserted through vocal cords under direct vision,  positive ETCO2 and breath sounds checked- equal and bilateral Secured at: 31 cm Tube secured with: Tape Dental Injury: Teeth and Oropharynx as per pre-operative assessment

## 2019-12-30 NOTE — H&P (Signed)
[address]       [place] [address]             [facility] Medical Record #[id number] Date of Birth: 10/17/1940  Referring: Tanda Rockers, MD Primary Care: Denny Levy, [place] Primary Cardiologist: Rozann Lesches, MD  Chief Complaint:    Chief Complaint  Patient presents with  . Pleural Effusion    Surgical eval with CXR today, CTA Chest 12/19/19, Thoracentesis 12/23/19   History of Present Illness:    Ryan Richards 79 y.o. male was  seen in the office at the request of Dr. Melvyn Novas for recurrent right pleural effusion.  The patient and his wife note that over the past several months he has had increasing overall weakness, fatigue to the point over the past 3 weeks he has become dependent on a wheelchair.  Because of increasing shortness of breath he was seen in the emergency room on December 06, 2019 and again on December 19, 2019.  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  In addition he was noted to have moderately large pleural effusion on CT. thoracentesis was done July 6 with 450 mL of what was described as bloody effusion removed and again 1500 drained on July 16-cytology for malignancy with negative x2   Report notes cloudy red fluid with a total protein of 3.7 amylase of 29 glucose of 99 albumin  2.3-on thoracentesis dated July 7 no chemistries were sent on the fluid from the 16th, no bacteria were noted   Patient notes that both he and his wife developed COVID infections in November 2020, he notes that he had recovered well from this before the current episodes of increasing fatigue and weakness.   Patient has a history of multiple lipomas being resected from his arms and body over the past 20 years.  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 PLEURAL FLUID, RIGHT, THORACENTESIS:  FINAL MICROSCOPIC DIAGNOSIS:  - No malignant cells identified  SPECIMEN ADEQUACY:  Satisfactory for evaluation  DIAGNOSTIC COMMENTS:  Inflammation present.   Current Activity/ Functional Status:  Patient is independent with mobility/ambulation, transfers, ADL's, IADL's.   Zubrod Score: At the time of surgery this patient's most appropriate activity status/level should be described as: []     0    Normal activity, no symptoms []     1    Restricted in physical strenuous activity but ambulatory, able to do out light work []     2    Ambulatory and capable of self care, unable to do work activities, up and about               >50 % of waking hours                              [  x]    3    Only limited self care, in bed greater than 50% of waking hours []     4    Completely disabled, no self care, confined to bed or chair []     5    Moribund   Past Medical History:  Diagnosis Date  . Ambulates with cane    "just since 11/2019"  . Arthritis   . Bronchitis   . COPD (chronic obstructive pulmonary disease) ([place])   . Dyspnea   . Dysrhythmia 11/2019   pt was told "a-fib and was normal for his age", recheck in 1 yr  . Essential hypertension   . GERD (gastroesophageal reflux disease)   . Hearing loss    lett ear - no hearing aids  . History of pneumonia 2015  . Panic attacks   . Prostate cancer ([place])   . Urinary frequency   . Wears glasses     Past Surgical History:  Procedure Laterality Date  . ANTERIOR CERVICAL  DECOMP/DISCECTOMY FUSION N/A 03/20/2015   Procedure: C4-5 C5-6 C6-7 Anterior cervical decompression/diskectomy/fusion;  Surgeon: Earnie Larsson, MD;  Location: [facility];  Service: Neurosurgery;  Laterality: N/A;  C4-5 C5-6 C6-7 Anterior cervical decompression/diskectomy/fusion  . BACK SURGERY     x4  . CARDIAC CATHETERIZATION  1992   pt. states that everything was fine  . COLONOSCOPY  2012   Through [facility] in [place].  Marland Kitchen COLONOSCOPY N/A 01/13/2019   Procedure: COLONOSCOPY;  Surgeon: Rogene Houston, MD;  [facility];  Service: Endoscopy;  Laterality: N/A;  730  . ESOPHAGOGASTRODUODENOSCOPY N/A 08/23/2015   Procedure: ESOPHAGOGASTRODUODENOSCOPY (EGD);  Surgeon: Rogene Houston, MD;  [facility];  Service: Endoscopy;  Laterality: N/A;  1:45  . KNEE SURGERY Left 1972  . POLYPECTOMY  01/13/2019   Procedure: POLYPECTOMY;  Surgeon: Rogene Houston, MD;  [facility]: [facility];  Service: Endoscopy;;  sigmoid colon  . PROSTATE SURGERY     removal    Family History  Problem Relation Age of Onset  . Colon cancer Mother   . Arthritis Father   . Aneurysm Father   . Hiatal hernia Sister   . Hepatitis C Brother   . Healthy Sister   . Healthy Sister   . Hiatal hernia Sister   . Healthy Sister   . Healthy Sister   . Heart disease Maternal Aunt      Social History   Tobacco Use  Smoking Status Former Smoker  . Packs/day: 1.00  . Years: 40.00  . Pack years: 40.00  . Quit date: 06/09/1997  . Years since quitting: 22.5  Smokeless Tobacco Never Used    Social History   Substance and Sexual Activity  Alcohol Use No  . Alcohol/week: 0.0 standard drinks     Allergies  Allergen Reactions  . Codeine     Itch, and bad headache  . Morphine And Related     Itch, and bad headache     Current Facility-Administered Medications  Medication Dose Route Frequency Provider Last Rate Last Admin  . bupivacaine liposome (EXPAREL) 1.3 % injection 266 mg  20 mL Infiltration  Once Grace Isaac, MD      . ceFAZolin (ANCEF) IVPB 2g/100 mL premix  2 g Intravenous 30 min Pre-Op Grace Isaac, MD      . lactated ringers infusion   Intravenous Continuous Audry Pili, MD        Pertinent items are noted in  HPI.   Review of Systems:     Cardiac Review of Systems: [Y] = yes  or   [ N ] = no   Chest Pain [  n  ]  Resting SOB [  y ] Exertional SOB  [ y ]  Orthopnea Blue.Reese  ]   Pedal Edema [ n  ]    Palpitations [n  ] Syncope  [ n ]   Presyncope [n   ]   General Review of Systems: [Y] = yes [  ]=no Constitional: recent weight change Blue.Reese  ];  Wt loss over the last 3 months [   ] anorexia [  ]; fatigue [ y ]; nausea [  ]; night sweats [  ]; fever [  ]; or chills [  ];           Eye : blurred vision [  ]; diplopia [   ]; vision changes [  ];  Amaurosis fugax[  ]; Resp: cough [  ];  wheezing[  ];  hemoptysis[  ]; shortness of breath[ y ]; paroxysmal nocturnal dyspnea[ y ]; dyspnea on exertion[y  ]; or orthopnea[  ];  GI:  gallstones[  ], vomiting[  ];  dysphagia[  ]; melena[  ];  hematochezia [  ]; heartburn[  ];   Hx of  Colonoscopy[  ]; GU: kidney stones [  ]; hematuria[  ];   dysuria [  ];  nocturia[  ];  history of     obstruction [  ]; urinary frequency [  ]             Skin: rash, swelling[  ];, hair loss[  ];  peripheral edema[  ];  or itching[  ]; Musculosketetal: myalgias[  ];  joint swelling[  ];  joint erythema[  ];  joint pain[  ];  back pain[  ];  Heme/Lymph: bruising[  ];  bleeding[  ];  anemia[  ];  Neuro: TIA[  ];  headaches[  ];  stroke[  ];  vertigo[  ];  seizures[  ];   paresthesias[  ];  difficulty walking[y  ];  Psych:depression[  ]; anxiety[  ];  Endocrine: diabetes[  ];  thyroid dysfunction[  ];  Immunizations: Flu up to date [ y ]; Pneumococcal up to date [ n ]; covid n infecte  Nov 2020  Other:     PHYSICAL EXAMINATION: BP (!) 155/84   Pulse 77   Temp 98.5 F (36.9 C) (Oral)   Resp 17   Ht 6' (1.829 m)   Wt (!) 95.3 kg   SpO2 96%    BMI 28.48 kg/m  General appearance: alert, cooperative, appears older than stated age, fatigued, mild distress and Patient comes into the office in a wheelchair as he notes that he is too fatigued to get around very well this is been over the past month Head: Normocephalic, without obvious abnormality, atraumatic Neck: no adenopathy, no carotid bruit, no JVD, supple, symmetrical, trachea midline and thyroid not enlarged, symmetric, no tenderness/mass/nodules Lymph nodes: Cervical, supraclavicular, and axillary nodes normal. Resp: diminished breath sounds RLL Cardio: regular rate and rhythm, S1, S2 normal, no murmur, click, rub or gallop GI: soft, non-tender; bowel sounds normal; no masses,  no organomegaly Extremities: extremities normal, atraumatic, no cyanosis or edema Neurologic: Grossly normal  Diagnostic Studies & Laboratory data:     Recent Radiology Findings:    DG Chest 2 View  Result Date: 12/27/2019 CLINICAL DATA:  Shortness of  breath, pleural effusion. EXAM: CHEST - 2 VIEW COMPARISON:  December 23, 2019. FINDINGS: Stable cardiomegaly. No pneumothorax is noted. Stable elevated left hemidiaphragm is noted. Left lung is clear. Small possibly loculated right pleural effusion is noted with associated right basilar atelectasis. Bony thorax is unremarkable. IMPRESSION: Small possibly loculated right pleural effusion is noted with associated right basilar atelectasis. Electronically Signed   By: Marijo Conception M.D.   On: 12/27/2019 13:59   CT Angio Chest PE W and/or Wo Contrast  Result Date: 12/19/2019 CLINICAL DATA:  Dyspnea, pneumonia, nonproductive cough, weakness EXAM: CT ANGIOGRAPHY CHEST WITH CONTRAST TECHNIQUE: Multidetector CT imaging of the chest was performed using the standard protocol during bolus administration of intravenous contrast. Multiplanar CT image reconstructions and MIPs were obtained to evaluate the vascular anatomy. CONTRAST:  131mL OMNIPAQUE IOHEXOL 350 MG/ML SOLN  COMPARISON:  12/06/2019 FINDINGS: Cardiovascular: Satisfactory opacification of the pulmonary arteries to the segmental level. No evidence of pulmonary embolism. Normal heart size. No pericardial effusion. The thoracic aorta is suboptimally opacified, but is unremarkable. Mediastinum/Nodes: No enlarged mediastinal, hilar, or axillary lymph nodes. Thyroid gland, trachea, and esophagus demonstrate no significant findings. Lungs/Pleura: Large right pleural effusion is again identified, similar in size to prior examination, with associated subtotal collapse of the right lower lobe. Mild ground-glass pulmonary infiltrate within the dependent left lower lobe is nonspecific, but could reflect trace pulmonary edema or inflammatory infiltrate. This appears new since prior examination. No pneumothorax. The central airways are widely patent. Upper Abdomen: Limited images of the upper abdomen are unremarkable. Musculoskeletal: Cervical fusion hardware is partially visualized. Degenerative changes are seen within the thoracic spine. No lytic or blastic bone lesions are seen. Review of the MIP images confirms the above findings. IMPRESSION: Large right pleural effusion, similar in size to prior examination, with associated subtotal collapse of the right lower lobe. Interval development of trace ground-glass pulmonary infiltrate within the left lower lobe, edema versus infection/inflammation. No pulmonary embolism Electronically Signed   By: Fidela Salisbury MD   On: 12/19/2019 15:59   US THORACENTESIS ASP PLEURAL SPACE W/IMG GUIDE  Result Date: 12/23/2019 INDICATION: Right pleural effusion EXAM: ULTRASOUND GUIDED RIGHT THORACENTESIS MEDICATIONS: None. COMPLICATIONS: Vagal reaction. PROCEDURE: An ultrasound guided thoracentesis was thoroughly discussed with the patient and questions answered. The benefits, risks, alternatives and complications were also discussed. The patient understands and wishes to proceed with the procedure.  Written consent was obtained. Ultrasound was performed to localize and mark an adequate pocket of fluid in the right chest. The area was then prepped and draped in the normal sterile fashion. 1% Lidocaine was used for local anesthesia. Under ultrasound guidance a Yueh catheter was introduced. Thoracentesis was performed. The catheter was removed and a dressing applied. During the thoracentesis, the patient experienced weakness along with bradycardia. He appeared clammy and the overall appearance was compatible with a vagal reaction. We placed the patient in the left lateral decubitus position and placed him in Trendelenburg. An IV was placed along with oxygen 2 L by nasal cannula. Blood pressure and pulse stabilized to normal and the procedure was completed. A postprocedural chest radiograph demonstrated no pneumothorax. The patient was sent to day surgery/short-stay for monitoring. FINDINGS: A total of approximately 1500 cc of serosanguineous fluid was removed. Samples were sent to the laboratory as requested by the clinical team. IMPRESSION: Successful ultrasound guided right thoracentesis yielding 1500 cc of pleural fluid. The patient did experience a vagal reaction during the procedure, treated conservatively as noted above and monitored  afterwards in short-stay. Electronically Signed   By: Van Clines M.D.   On: 12/23/2019 12:35   US THORACENTESIS ASP PLEURAL SPACE W/IMG GUIDE  Result Date: 12/13/2019 INDICATION: RIGHT pleural effusion EXAM: ULTRASOUND GUIDED DIAGNOSTIC AND THERAPEUTIC RIGHT THORACENTESIS MEDICATIONS: None. COMPLICATIONS: None immediate. PROCEDURE: An ultrasound guided thoracentesis was thoroughly discussed with the patient and questions answered. The benefits, risks, alternatives and complications were also discussed. The patient understands and wishes to proceed with the procedure. Written consent was obtained. Ultrasound was performed to localize and mark an adequate pocket of  fluid in the RIGHT chest. The area was then prepped and draped in the normal sterile fashion. 1% Lidocaine was used for local anesthesia. Under ultrasound guidance a 8 French thoracentesis catheter was introduced. Thoracentesis was performed. The catheter was removed and a dressing applied. FINDINGS: A total of approximately 450 mL of dark bloody fluid was removed. Samples were sent to the laboratory as requested by the clinical team. IMPRESSION: Successful ultrasound guided RIGHT thoracentesis yielding 450 mL in of pleural fluid. Electronically Signed   By: Lavonia Dana M.D.   On: 12/13/2019 11:11     I have independently reviewed the above radiology studies  and reviewed the findings with the patient.   Recent Lab Findings: Lab Results  Component Value Date   WBC 7.9 12/28/2019   HGB 15.5 12/28/2019   HCT 46.7 12/28/2019   PLT 466 (H) 12/28/2019   GLUCOSE 106 (H) 12/28/2019   ALT 14 12/28/2019   AST 15 12/28/2019   NA 132 (L) 12/28/2019   K 4.0 12/28/2019   CL 101 12/28/2019   CREATININE 0.96 12/28/2019   BUN 11 12/28/2019   CO2 19 (L) 12/28/2019   INR 1.1 12/28/2019   Echo: 11/22/2019 Left ventricular ejection fraction, by estimation, is 55 to 60%. The left ventricle has normal function. Left ventricular endocardial border not optimally defined to evaluate regional wall motion. Left ventricular diastolic parameters are consistent with Grade I diastolic dysfunction (impaired relaxation). 2. Right ventricular systolic function is normal. The right ventricular size is normal. There is normal pulmonary artery systolic pressure. 3. The mitral valve is normal in structure. No evidence of mitral valve regurgitation. No evidence of mitral stenosis. 4. The aortic valve was not well visualized. Aortic valve regurgitation is mild. No aortic stenosis is present. 5. Aortic dilatation noted. There is mild dilatation of the aortic root measuring 43 mm. 6. The inferior vena cava is normal in size  with greater than 50% respiratory variability, suggesting right atrial pressure of 3 mmHg.  Assessment / Plan:   #1 recurrent bloody right pleural effusion-tapped x2 over the past 10 days-initially cytology are negative for malignancy.  With the new onset of generalized weakness recurrent right pleural effusion without evidence of congestive heart failure by echo or history I discussed options with the patient.  Pleurx catheter could be placed for symptomatic relief but unlikely to add any more information to our current diagnosis as pleural cytologies have been negative twice.  Although more invasive I recommend to the patient that we proceed with bronchoscopy right robotic assisted thoracoscopy with drainage of residual fluid multiple pleural biopsies -although there is no significant pleural thickening noted on CT scan the patient has had previous asbestos exposure.  We also discuss talc pleurodesis with sterile powder talc at this time of intervention. Patient is agreeable with proceeding  #2 paralyzed left hemidiaphragm secondary to left neck surgery  #3 generalized weakness to the point of needing a  wheelchair over the past month    The goals risks and alternatives of the planned surgical procedure Procedure(s): XI ROBOTIC ASSISTED THORACOSCOPY FOR PLEURAL BIOPSY WITH POSSIBLE PLEURECTOMY (Right) INSERTION PLEURAL DRAINAGE CATHETER (Right) TALC PLEURADESIS (Right) VIDEO BRONCHOSCOPY (N/A)  have been discussed with the patient in detail. The risks of the procedure including death, infection, stroke, myocardial infarction, bleeding, blood transfusion have all been discussed specifically.  I have quoted Ryan Richards a 1 % of perioperative mortality and a complication rate as high as 10 %. The patient's questions have been answered.Ryan Richards is willing  to proceed with the planned procedure.  Grace Isaac MD      [address] [place] [address] Office  [phone]     12/30/2019 7:06 AM

## 2019-12-30 NOTE — Anesthesia Procedure Notes (Signed)
Procedure Name: Intubation Date/Time: 12/30/2019 7:40 AM Performed by: Kathryne Hitch, CRNA Pre-anesthesia Checklist: Patient identified, Emergency Drugs available, Suction available and Patient being monitored Patient Re-evaluated:Patient Re-evaluated prior to induction Oxygen Delivery Method: Circle system utilized Preoxygenation: Pre-oxygenation with 100% oxygen Induction Type: IV induction Ventilation: Mask ventilation without difficulty Laryngoscope Size: Mac and 4 Grade View: Grade I Tube type: Oral Tube size: 8.5 mm Number of attempts: 1 Airway Equipment and Method: Stylet and Oral airway Placement Confirmation: ETT inserted through vocal cords under direct vision,  positive ETCO2 and breath sounds checked- equal and bilateral Secured at: 24 cm Tube secured with: Tape Dental Injury: Teeth and Oropharynx as per pre-operative assessment

## 2019-12-30 NOTE — Transfer of Care (Signed)
Immediate Anesthesia Transfer of Care Note  Patient: Ryan Richards  Procedure(s) Performed: XI ROBOTIC ASSISTED THORACOSCOPY WITH MULTIPLE PLEURAL BIOPSIES (Right Chest) INSERTION PLEURAL DRAINAGE CATHETER (PLEUREX) (Right Chest) TALC PLEURADESIS (Right Chest) VIDEO BRONCHOSCOPY (N/A ) INTERCOSTAL NERVE BLOCK (Right Chest)  Patient Location: PACU  Anesthesia Type:General  Level of Consciousness: drowsy and patient cooperative  Airway & Oxygen Therapy: Patient Spontanous Breathing and Patient connected to face mask oxygen  Post-op Assessment: Report given to RN and Post -op Vital signs reviewed and stable  Post vital signs: Reviewed and stable  Last Vitals:  Vitals Value Taken Time  BP 155/77 12/30/19 1100  Temp    Pulse 68 12/30/19 1102  Resp 20 12/30/19 1102  SpO2 100 % 12/30/19 1102  Vitals shown include unvalidated device data.  Last Pain:  Vitals:   12/30/19 0611  TempSrc: Oral  PainSc:          Complications: No complications documented.

## 2019-12-30 NOTE — Anesthesia Procedure Notes (Signed)
Arterial Line Insertion Start/End7/23/2021 6:51 AM, 12/30/2019 6:51 AM Performed by: Kathryne Hitch, CRNA, CRNA  Patient location: Pre-op. Preanesthetic checklist: patient identified, IV checked, site marked, risks and benefits discussed, surgical consent, monitors and equipment checked, pre-op evaluation and timeout performed Lidocaine 1% used for infiltration Right, radial was placed Catheter size: 20 Fr Hand hygiene performed  and maximum sterile barriers used   Attempts: 1 Procedure performed without using ultrasound guided technique. Following insertion, dressing applied and Biopatch. Post procedure assessment: normal and unchanged  Patient tolerated the procedure well with no immediate complications. Additional procedure comments: Champ Mungo, SRNA placed Aline. Marland Kitchen

## 2019-12-30 NOTE — Anesthesia Postprocedure Evaluation (Signed)
Anesthesia Post Note  Patient: Ryan Richards  Procedure(s) Performed: XI ROBOTIC ASSISTED THORACOSCOPY WITH MULTIPLE PLEURAL BIOPSIES (Right Chest) INSERTION PLEURAL DRAINAGE CATHETER (PLEUREX) (Right Chest) TALC PLEURADESIS (Right Chest) VIDEO BRONCHOSCOPY (N/A ) INTERCOSTAL NERVE BLOCK (Right Chest)     Patient location during evaluation: PACU Anesthesia Type: General Level of consciousness: awake and alert Pain management: pain level controlled Vital Signs Assessment: post-procedure vital signs reviewed and stable Respiratory status: spontaneous breathing, nonlabored ventilation, respiratory function stable and patient connected to nasal cannula oxygen Cardiovascular status: blood pressure returned to baseline and stable Postop Assessment: no apparent nausea or vomiting Anesthetic complications: no   No complications documented.  Last Vitals:  Vitals:   12/30/19 1130 12/30/19 1145  BP: (!) 138/90 (!) 137/76  Pulse: 77 76  Resp: 15 14  Temp:    SpO2: 94% 91%    Last Pain:  Vitals:   12/30/19 1130  TempSrc:   PainSc: [place]

## 2019-12-30 NOTE — Op Note (Signed)
NAME: Ryan Richards, Ryan Richards MEDICAL RECORD [id number] ACCOUNT [id number] DATE OF BIRTH:1941/05/26 FACILITY: [facility] LOCATION: [facility] PHYSICIAN:Lestat Golob Maryruth Bun, MD  OPERATIVE REPORT  DATE OF PROCEDURE:  12/30/2019  PREOPERATIVE DIAGNOSIS:  Recurrent right bloody pericardial effusion.  POSTOPERATIVE DIAGNOSES:   1.  Recurrent right bloody pericardial effusion.   2.  Malignant pleural biopsy on frozen section.  PROCEDURE PERFORMED: 1.  Video bronchoscopy with brushings, superior segment, right lower lobe. 2.  Right robotic-assisted thoracoscopy with drainage of pleural effusion.   3.  Multiple pleural biopsies.  4.  Talc pleurodesis.  5.  Placement of PleurX catheter. 6.  Intercostal nerve block with Exparel.  SURGEON:  Lanelle Bal, MD  FIRST ASSISTANT:  Enid Cutter, PA.  BRIEF HISTORY:  The patient is a 79 year old male who was referred by Dr. Morene Rankins because of recurrent bloody pericardial effusions with thoracentesis done twice and no definite diagnosis.  The patient was seen in the office and had had a fairly rapid  decline in his overall health status and wellbeing over the past month, twice had been to the emergency room with thoracentesis, once 450 and once 1.5 L without any definitive diagnosis.  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  He has had a history of exposure to asbestos years ago when he worked in [employer].  After seeing the patient in the office, I recommended that we proceed with a definitive procedure to obtain a diagnosis as soon as possible.   The patient was agreeable with this approach and signed informed consent.  DESCRIPTION OF PROCEDURE:  With arterial line and IVs in place, the patient underwent general endotracheal anesthesia with a single lumen endotracheal tube.  Appropriate timeout was performed.  A video bronchoscope 2.8 mm in size was passed through the  endotracheal tube to the  subsegmental level in the right and left tracheobronchial tree.  The left tracheobronchial tree was clear of any obstructions.  The right upper lobe was clear of obstruction.  The right middle lobe was clear of obstruction.  As  we moved to the right lower lobe, there appeared to be some extrinsic compression on the takeoff of the superior segment branch of the right lower lobe.  This area was brushed with a cytology brush with several passes.  No definite mass could be seen.   The scopes were removed.  We then transitioned the patient to a double lumen endotracheal tube.  The right side had preoperatively been marked.  The patient was turned in lateral decubitus position with the right side up.  The right chest was prepped  with Betadine, draped in a sterile manner.  We then placed three 8 mm ports into the right chest along the 8th intercostal space.  We approached this initially with the central most port with a 5 mm scope and an Optiview port and gained entrance into the  chest.  Mild insufflation was started.  It obvious with this that the patient had some adhesions of the lung, especially medially and posteriorly.  With the scope in place, we were able to place a more anterior 8 mm port and a more posterior 8 mm port,  each approximately 4 cm away.  With 3 robotic 8 mm ports positioned, we then moved down several centimeters and placed a 12 mm accessory port.  The robot was then brought into position.  The camera was targeted.  The remaining 2 arms were attached.  A  fenestrated bipolar  and a tip-up grasper were then placed.  We then transitioned with the surgeon at the robotic console.  Some minor adhesions were taken down to allow more free movement within the chest. With the visualization of the robot, we were  easily able to see multiple throughout the chest of pleural plaques that appeared malignant.  Multiple biopsies with the bipolar device were used and placed in glove tips  and extracted for  frozen section.  The initial 2 biopsies were sent for frozen  section and confirmed malignancy.  Additional similar material was sent for permanent section and appropriate genetic studies as indicated.  The chest was irrigated and suctioned clear of any effusion.  The robot was then undocked and through the 5 mm  camera, we did intercostal nerve blocks posterior from approximately the 3rd interspace to the 10th interspace.  This was done with a mixture of 50 mL of saline, Exparel and Marcaine.  We then proceeded with placement of sterile talc powder, 4 g  insufflated into the chest cavity from an anterior port and 4 g from a posterior port.  This provided good coverage.  A PleurX catheter was then placed under direct vision into the chest with a subcutaneous tunnel to the fibrous cuff.  The tube was  secured in place.  We then placed a single 28 chest tube through the most anterior 8 mm port.  The remaining 3 ports were then closed with UR-6 sutures and 3-0 subcuticular stitch.  Dermabond was applied.  The lung reinflated.  The patient was awakened  and extubated in the operating room, having tolerated the procedure without obvious complication.  Sponge and needle count was reported as correct at completion of procedure.  Approximately 150-200 mL of bloody pleural fluid was evacuated from the chest.   However, active blood loss was minimal.  With the patient was extubated in the operating room,  he was then transferred to the recovery room for postoperative observation.       VN/NUANCE  D:12/30/2019 T:12/30/2019 JOB:[id number]

## 2019-12-30 NOTE — Progress Notes (Signed)
Chest tube dressing saturated with serosanguinous drainage. Dressing changed tolerated well.

## 2019-12-30 NOTE — Brief Op Note (Addendum)
      [address]       [place] [address]             [phone]    12/30/2019  10:38 AM  PATIENT:  Ryan Richards  79 y.o. male  PRE-OPERATIVE DIAGNOSIS: recurrent   right pleural effusion  POST-OPERATIVE DIAGNOSIS:  Malignant pleural effusion ; Final pathology pending   PROCEDURE:  Procedure(s): XI ROBOTIC ASSISTED THORACOSCOPY WITH MULTIPLE PLEURAL BIOPSIES (Right) INSERTION PLEURAL DRAINAGE CATHETER (PLEUREX) (Right) TALC PLEURADESIS (Right) VIDEO BRONCHOSCOPY (N/A) INTERCOSTAL NERVE BLOCK (Right)  SURGEON:  Grace Isaac, MD - Primary  PHYSICIAN ASSISTANT: Roddenberry  ANESTHESIA:   general  EBL:  150 mL   BLOOD ADMINISTERED:none  DRAINS:Right pleural 2fr Blake drain, PleurX catheter   LOCAL MEDICATIONS USED:  Intercostal Exparel  SPECIMEN:  Source of Specimen:  Right pleural fluid, Multiple pleural Biopsies  DISPOSITION OF SPECIMEN:  PATHOLOGY  COUNTS:  YES  DICTATION: .Dragon Dictation  PLAN OF CARE: Admit to inpatient   PATIENT DISPOSITION:  PACU - hemodynamically stable.   Delay start of Pharmacological VTE agent (>24hrs) due to surgical blood loss or risk of bleeding: no

## 2019-12-30 NOTE — Progress Notes (Signed)
Wasted 30 ml of Dilaudid with Toma Aran via waste container.  Also wasted 1/2 tab 0.125 of xanax with Janett Billow as well via sharps container.  Saunders Revel T

## 2019-12-30 NOTE — Discharge Summary (Signed)
Physician Discharge Summary  Patient ID: Ryan Richards MRN: [id number] DOB/AGE: March 03, 1941 79 y.o.  Admit date: 12/30/2019 Discharge date: 01/02/2020  Admission Diagnoses:  Right Pleural Effusion Chest pain Hypertension GERD Cervical Spinal Stenosis History of diverticulitis Barrett's Esophagus  Discharge Diagnoses:   Malignant pleural effusion,Right Chest pain Hypertension GERD Cervical Spinal Stenosis History of diverticulitis Barrett's Esophagus   Discharged Condition: stable  History of Present Illness:    Ryan Richards 79 y.o. male was  seen in the office at the request of Dr. Melvyn Novas for recurrent right pleural effusion.  The patient and his wife note that over the past several months he has had increasing overall weakness, fatigue to the point over the past 3 weeks he has become dependent on a wheelchair.  Because of increasing shortness of breath he was seen in the emergency room on December 06, 2019 and again on December 19, 2019.  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  In addition he was noted to have moderately large pleural effusion on CT. thoracentesis was done July 6 with 450 mL of what was described as bloody effusion removed and again 1500 drained on July 16-cytology for malignancy with negative x2   Report notes cloudy red fluid with a total protein of 3.7 amylase of 29 glucose of 99 albumin  2.3-on thoracentesis dated July 7 no chemistries were sent on the fluid from the 16th, no bacteria were noted  Patient notes that both he and his wife developed COVID infections in November 2020, he notes that he had recovered well from this before the current episodes of increasing fatigue and weakness.  Patient has a history of multiple lipomas being resected from his arms and body over the past 20 years.  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 PLEURAL FLUID, RIGHT, THORACENTESIS:  FINAL MICROSCOPIC DIAGNOSIS:  - No malignant cells identified  SPECIMEN ADEQUACY:  Satisfactory for evaluation  DIAGNOSTIC COMMENTS:  Inflammation present.   Further diagnostic work up including bronchoscopy, robotic-assisted thoracoscopy for biopsy were discussed with the patient by Dr. Servando Snare and the decision was made to proceed.   Hospital Course:  [name] was admitted to the hospital for elective same-day surgery, he was prepared and taken to the operating room where right robotic assisted thoracoscopy was carried out with drainage of small pleural effusion.  Multiple pleural biopsies were also obtained for both frozen and permanent section.  Frozen section showed this to be a malignant process, possible mesothelioma.  We proceeded with talc pleurodesis and placement of a Pleurx catheter.  He tolerated procedure well.  Following the surgery.  He was recovered in the postanesthesia care unit and later transferred to progressive care.  He has had moderate chest tube drainage and chest tube was removed on 01/01/2020.  He will start a course of intermittent Pleurx drainage following this.  His vitals have remained stable and  he has been afebrile.  His oxygen has been weaned and he maintains good saturations on room air.  Renal function has remained within normal limits.  He has a very mild anemia with most recent hemoglobin hematocrit 12.1/36.2.  He does not have any leukocytosis.  His blood  sugars have been under adequate control.  Final pathology is currently pending. Drainage of the PleurX catheter on the day of discharge yielded only 42ml of fluid.   Home nursing is being arranged to assist with management of the Pleurx.  Oncology follow-up will be arranged for the patient as well.  At the time of discharge the patient is felt to be quite stable.   Consults: None  Significant Diagnostic Studies:   EXAM: CT ANGIOGRAPHY CHEST WITH CONTRAST  TECHNIQUE: Multidetector CT imaging of the chest was performed using the standard protocol during bolus administration of intravenous contrast. Multiplanar CT image reconstructions and MIPs were obtained to evaluate the vascular anatomy.  CONTRAST:  178mL OMNIPAQUE IOHEXOL 350 MG/ML SOLN  COMPARISON:  12/06/2019  FINDINGS: Cardiovascular: Satisfactory opacification of the pulmonary arteries to the segmental level. No evidence of pulmonary embolism. Normal heart size. No pericardial effusion. The thoracic aorta is suboptimally opacified, but is unremarkable.  Mediastinum/Nodes: No enlarged mediastinal, hilar, or axillary lymph nodes. Thyroid gland, trachea, and esophagus demonstrate no significant findings.  Lungs/Pleura: Large right pleural effusion is again identified, similar in size to prior examination, with associated subtotal collapse of the right lower lobe. Mild ground-glass pulmonary infiltrate within the dependent left lower lobe is nonspecific, but could reflect trace pulmonary edema or inflammatory infiltrate. This appears new since prior examination. No pneumothorax. The central airways are widely patent.  Upper Abdomen: Limited images of the upper abdomen are unremarkable.  Musculoskeletal: Cervical fusion hardware is partially visualized. Degenerative changes are seen within the thoracic spine. No lytic or blastic bone lesions are seen.  Review of the MIP images confirms the above  findings.  IMPRESSION: Large right pleural effusion, similar in size to prior examination, with associated subtotal collapse of the right lower lobe.  Interval development of trace ground-glass pulmonary infiltrate within the left lower lobe, edema versus infection/inflammation.  No pulmonary embolism   Electronically Signed   By: Fidela Salisbury MD   On: 12/19/2019 15:59 Placey Pleurx cath  Treatments: surgery:  OPERATIVE REPORT  DATE OF PROCEDURE:  12/30/2019  PREOPERATIVE DIAGNOSIS:  Recurrent right bloody pericardial effusion.  POSTOPERATIVE DIAGNOSES:   1.  Recurrent right bloody pericardial effusion.   2.  Malignant pleural biopsy on frozen section.  PROCEDURE PERFORMED: 1.  Video bronchoscopy with brushings, superior segment, right lower lobe. 2.  Right robotic-assisted thoracoscopy with drainage of pleural effusion.   3.  Multiple pleural biopsies.  4.  Talc pleurodesis.  5.  Placement of PleurX catheter. 6.  Intercostal nerve block with Exparel.  SURGEON:  Lanelle Bal, MD  FIRST ASSISTANT:  Enid Cutter, PA.    Discharge Exam: Blood pressure (!) 138/97, pulse 94, temperature 97.9 F (36.6 C), temperature source Oral, resp. rate (!) 26, height 6' (1.829 m), weight (!) 95.3 kg, SpO2 93 %. General appearance: alert, cooperative and no distress Neurologic: intact Heart: RRR Lungs: Breath sounds clear, diminished on Rt.  Abdomen: Soft and NT, active bowel sounds. Extremities: No edema or LE tenderness. Wound: right chest port sites dry.   Disposition:   Mr. Masoner is discharged to home in stable condition.    Discharge Instructions    Pleural Drainage Schedule  Complete by: As directed    Drain up to max of 1L until patient is only able to drain out 180ml. If <159ml for 3 consecutive drains then drain every other day. If <176ml for 3 consecutive drains every other day then call the practice that inserted the catheter for  evaluation and possible removal. [facility] office [phone])     Allergies as of 01/02/2020      Reactions   Codeine    Itch, and bad headache   Morphine And Related    Itch, and bad headache      Medication List    TAKE these medications   acetaminophen 500 MG tablet Commonly known as: TYLENOL Take 1,000 mg by mouth every 6 (six) hours as needed for moderate pain.   albuterol 108 (90 Base) MCG/ACT inhaler Commonly known as: VENTOLIN HFA Inhale 2 puffs into the lungs every 6 (six) hours as needed for wheezing or shortness of breath.   albuterol (2.5 MG/3ML) 0.083% nebulizer solution Commonly known as: PROVENTIL Inhale 3 mLs into the lungs in the morning and at bedtime.   ALPRAZolam 0.25 MG tablet Commonly known as: XANAX Take 0.5 tablets by mouth at bedtime.   amLODipine 5 MG tablet Commonly known as: NORVASC Take 5 mg by mouth daily.   cyanocobalamin 1000 MCG tablet Take 1,000 mcg by mouth daily. Takes 2 daily   ibuprofen 400 MG tablet Commonly known as: ADVIL Take 400 mg by mouth every 6 (six) hours as needed.   omeprazole 20 MG capsule Commonly known as: PRILOSEC Take 20 mg by mouth daily.   OSTEO BI-FLEX ADV TRIPLE ST PO Take 2 capsules by mouth daily.   PARoxetine 10 MG tablet Commonly known as: PAXIL Take 10 mg by mouth daily. Started on 12/22/19   traMADol 50 MG tablet Commonly known as: ULTRAM Take 1 tablet (50 mg total) by mouth every 6 (six) hours as needed for up to 5 days (mild pain).       Follow-up Information    Grace Isaac, MD. Go on 01/17/2020.   Specialty: Cardiothoracic Surgery Why: Your appointment is at 4:30pm on 01/17/20.   Also obtain a chest x-ray from [facility] 1/2-hour prior to this appointment.  It is located in the same office complex.  Please also check for all appointments under the MyChart application. Contact information: [address] [place] [address] [phone]                Signed: Antony Odea 01/02/2020, 10:22 AM

## 2019-12-31 ENCOUNTER — Encounter (HOSPITAL_COMMUNITY): Payer: Self-pay | Admitting: Cardiothoracic Surgery

## 2019-12-31 ENCOUNTER — Inpatient Hospital Stay (HOSPITAL_COMMUNITY): Payer: Medicare Other

## 2019-12-31 LAB — BLOOD GAS, ARTERIAL
Acid-Base Excess: 1.4 mmol/L (ref 0.0–2.0)
Bicarbonate: 25.3 mmol/L (ref 20.0–28.0)
Drawn by: 28340
FIO2: 21
O2 Saturation: 94.9 %
Patient temperature: 36.5
pCO2 arterial: 37.2 mmHg (ref 32.0–48.0)
pH, Arterial: 7.444 (ref 7.350–7.450)
pO2, Arterial: 69.1 mmHg — ABNORMAL LOW (ref 83.0–108.0)

## 2019-12-31 LAB — CBC
HCT: 36.1 % — ABNORMAL LOW (ref 39.0–52.0)
Hemoglobin: 12.4 g/dL — ABNORMAL LOW (ref 13.0–17.0)
MCH: 31.4 pg (ref 26.0–34.0)
MCHC: 34.3 g/dL (ref 30.0–36.0)
MCV: 91.4 fL (ref 80.0–100.0)
Platelets: 361 10*3/uL (ref 150–400)
RBC: 3.95 MIL/uL — ABNORMAL LOW (ref 4.22–5.81)
RDW: 11.7 % (ref 11.5–15.5)
WBC: 13.9 10*3/uL — ABNORMAL HIGH (ref 4.0–10.5)
nRBC: 0 % (ref 0.0–0.2)

## 2019-12-31 LAB — BASIC METABOLIC PANEL
Anion gap: 8 (ref 5–15)
BUN: 7 mg/dL — ABNORMAL LOW (ref 8–23)
CO2: 24 mmol/L (ref 22–32)
Calcium: 8.7 mg/dL — ABNORMAL LOW (ref 8.9–10.3)
Chloride: 103 mmol/L (ref 98–111)
Creatinine, Ser: 0.89 mg/dL (ref 0.61–1.24)
GFR calc Af Amer: >60 mL/min (ref >60)
GFR calc non Af Amer: >60 mL/min (ref >60)
Glucose, Bld: 124 mg/dL — ABNORMAL HIGH (ref 70–99)
Potassium: 3.8 mmol/L (ref 3.5–5.1)
Sodium: 135 mmol/L (ref 135–145)

## 2019-12-31 MED ORDER — PAROXETINE HCL 10 MG PO TABS
10.0000 mg | ORAL_TABLET | Freq: Every day | ORAL | Status: DC
  Administered 2019-12-31 – 2020-01-02 (×3): 10 mg via ORAL
  Filled 2019-12-31 (×3): qty 1

## 2019-12-31 NOTE — Progress Notes (Signed)
Pt ambulated in a hallway without chest pain and SOB, chest tube site is CDI and dressing changed, pt voided after Foley out, wife is in bed side and updated with care, no any specific complain this time and will continue to monitor  Palma Holter, RN

## 2019-12-31 NOTE — Progress Notes (Addendum)
[address]       Ryan Richards [address]             [phone]      1 Day Post-Op Procedure(s) (LRB): XI ROBOTIC ASSISTED THORACOSCOPY WITH MULTIPLE PLEURAL BIOPSIES (Right) INSERTION PLEURAL DRAINAGE CATHETER (PLEUREX) (Right) TALC PLEURADESIS (Right) VIDEO BRONCHOSCOPY (N/A) INTERCOSTAL NERVE BLOCK (Right) Subjective: Feels ok, not using pain meds  Objective: Vital signs in last 24 hours: Temp:  [97.5 F (36.4 C)-98.8 F (37.1 C)] 98.5 F (36.9 C) (07/24 0738) Pulse Rate:  [55-90] 65 (07/24 0329) Cardiac Rhythm: Heart block (07/24 0738) Resp:  [10-25] 20 (07/24 0738) BP: (116-155)/(62-90) 135/77 (07/24 0738) SpO2:  [91 %-100 %] 95 % (07/24 0738) Arterial Line BP: (87-159)/(62-95) 87/68 (07/24 0738)  Hemodynamic parameters for last 24 hours:    Intake/Output from previous day: 07/23 0701 - 07/24 0700 In: 4405.2 [P.O.:480; I.V.:3725.2; IV Piggyback:200] Out: 2435 [Urine:1755; Blood:150; Chest Tube:530] Intake/Output this shift: Total I/O In: 250 [P.O.:250] Out: 210 [Urine:200; Chest Tube:10]  General appearance: alert, cooperative and no distress Heart: regular rate and rhythm Lungs: clear to auscultation bilaterally Abdomen: benign Extremities: no edema or calf tenderness Wound: dressings CDI, incis healing well  Lab Results: Recent Labs    12/28/19 1059 12/31/19 0451  WBC 7.9 13.9*  HGB 15.5 12.4*  HCT 46.7 36.1*  PLT 466* 361   BMET:  Recent Labs    12/28/19 1059 12/31/19 0451  NA 132* 135  K 4.0 3.8  CL 101 103  CO2 19* 24  GLUCOSE 106* 124*  BUN 11 7*  CREATININE 0.96 0.89  CALCIUM 9.2 8.7*    PT/INR:  Recent Labs    12/28/19 1059  LABPROT 13.7  INR 1.1   ABG    Component Value Date/Time   PHART 7.444 12/31/2019 0455   HCO3 25.3 12/31/2019 0455   ACIDBASEDEF 1.2 12/28/2019 1117   O2SAT 94.9 12/31/2019 0455   CBG (last 3)  No results for input(s): GLUCAP in the last 72 hours.  Meds Scheduled Meds: .  acetaminophen  1,000 mg Oral Q6H   Or  . acetaminophen (TYLENOL) oral liquid 160 mg/5 mL  1,000 mg Oral Q6H  . bisacodyl  10 mg Oral Daily  . Chlorhexidine Gluconate Cloth  6 each Topical Daily  . enoxaparin (LOVENOX) injection  40 mg Subcutaneous QHS  . senna-docusate  1 tablet Oral QHS   Continuous Infusions: . sodium chloride    . sodium chloride 1,000 mL (12/30/19 2343)   PRN Meds:.Place/Maintain arterial line **AND** sodium chloride, ALPRAZolam, ondansetron (ZOFRAN) IV, oxyCODONE, traMADol  Xrays DG CHEST PORT 1 VIEW  Result Date: 12/31/2019 CLINICAL DATA:  Status post robotic assisted thoracoscopy EXAM: PORTABLE CHEST 1 VIEW COMPARISON:  12/30/2019 FINDINGS: The right chest tube is in place without visible pneumothorax. Stable mild cardiac enlargement. Decreased lung volumes are noted with small bilateral pleural effusions. No airspace consolidation or interstitial edema. IMPRESSION: 1. No pneumothorax. 2. Small bilateral pleural effusions. Electronically Signed   By: Kerby Moors M.D.   On: 12/31/2019 09:54   DG Chest Port 1 View  Result Date: 12/30/2019 CLINICAL DATA:  79 year old male with history of PleurX catheter insertion. EXAM: PORTABLE CHEST 1 VIEW COMPARISON:  Chest x-ray 12/27/2019. FINDINGS: New right-sided chest tubes in position with tips projecting over the upper right hemithorax and in the right base. Small residual right pleural effusion. No appreciable pneumothorax. Small amount of gas in the right chest wall. Enlarging moderate  left pleural effusion. Opacity at the left base may reflect associated subsegmental atelectasis. No evidence of pulmonary edema. Heart size appears borderline enlarged. Upper mediastinal contours are within normal limits. Orthopedic fixation hardware in the lower cervical spine incidentally noted. IMPRESSION: 1. New right-sided chest tubes with decreased size of what is now a small right pleural effusion. 2. Enlarging moderate left pleural  effusion with probable subsegmental atelectasis in the left lung base. Electronically Signed   By: Vinnie Langton M.D.   On: 12/30/2019 15:30    Assessment/Plan: S/P Procedure(s) (LRB): XI ROBOTIC ASSISTED THORACOSCOPY WITH MULTIPLE PLEURAL BIOPSIES (Right) INSERTION PLEURAL DRAINAGE CATHETER (PLEUREX) (Right) TALC PLEURADESIS (Right) VIDEO BRONCHOSCOPY (N/A) INTERCOSTAL NERVE BLOCK (Right)  1 afeb, VSS 2 sats good on RA 3 minor leukocytosis 4 Minor ABL anemia 5 Normal renal fxn 6 d/c aline and foley 6 CXR small effusions, no pntx 7 CT 500 cc yesterday, no air leak- poss d/c tube and use pleurx for fluid soon 8 routine rehab/pulm toilet   LOS: 1 day    John Giovanni PA-C Pager [phone] 12/31/2019  Plan d/c standard chest tube tomorrow  Poss home Monday  Chest xray ok this am I have seen and examined Ryan Richards and agree with the above assessment  and plan.  Grace Isaac MD Beeper [phone] Office [phone] 12/31/2019 11:28 AM

## 2020-01-01 LAB — COMPREHENSIVE METABOLIC PANEL
ALT: 13 U/L (ref 0–44)
AST: 14 U/L — ABNORMAL LOW (ref 15–41)
Albumin: 2.2 g/dL — ABNORMAL LOW (ref 3.5–5.0)
Alkaline Phosphatase: 56 U/L (ref 38–126)
Anion gap: 7 (ref 5–15)
BUN: 7 mg/dL — ABNORMAL LOW (ref 8–23)
CO2: 27 mmol/L (ref 22–32)
Calcium: 8.6 mg/dL — ABNORMAL LOW (ref 8.9–10.3)
Chloride: 102 mmol/L (ref 98–111)
Creatinine, Ser: 0.86 mg/dL (ref 0.61–1.24)
GFR calc Af Amer: >60 mL/min (ref >60)
GFR calc non Af Amer: >60 mL/min (ref >60)
Glucose, Bld: 91 mg/dL (ref 70–99)
Potassium: 4.3 mmol/L (ref 3.5–5.1)
Sodium: 136 mmol/L (ref 135–145)
Total Bilirubin: 0.7 mg/dL (ref 0.3–1.2)
Total Protein: 5.3 g/dL — ABNORMAL LOW (ref 6.5–8.1)

## 2020-01-01 LAB — CBC
HCT: 36.2 % — ABNORMAL LOW (ref 39.0–52.0)
Hemoglobin: 12.1 g/dL — ABNORMAL LOW (ref 13.0–17.0)
MCH: 30.9 pg (ref 26.0–34.0)
MCHC: 33.4 g/dL (ref 30.0–36.0)
MCV: 92.6 fL (ref 80.0–100.0)
Platelets: 342 10*3/uL (ref 150–400)
RBC: 3.91 MIL/uL — ABNORMAL LOW (ref 4.22–5.81)
RDW: 11.9 % (ref 11.5–15.5)
WBC: 10.2 10*3/uL (ref 4.0–10.5)
nRBC: 0 % (ref 0.0–0.2)

## 2020-01-01 NOTE — Progress Notes (Addendum)
[address]       ,[place] [address]             [phone]      2 Days Post-Op Procedure(s) (LRB): XI ROBOTIC ASSISTED THORACOSCOPY WITH MULTIPLE PLEURAL BIOPSIES (Right) INSERTION PLEURAL DRAINAGE CATHETER (PLEUREX) (Right) TALC PLEURADESIS (Right) VIDEO BRONCHOSCOPY (N/A) INTERCOSTAL NERVE BLOCK (Right) Subjective: Feels constipated, not SOB, somewhat generally weak  Objective: Vital signs in last 24 hours: Temp:  [97.6 F (36.4 C)-98.6 F (37 C)] 98.6 F (37 C) (07/25 0721) Pulse Rate:  [71-90] 90 (07/25 0721) Cardiac Rhythm: Heart block (07/25 0740) Resp:  [15-23] 18 (07/25 0721) BP: (116-147)/(73-90) 141/74 (07/25 0721) SpO2:  [92 %-98 %] 92 % (07/25 0721)  Hemodynamic parameters for last 24 hours:    Intake/Output from previous day: 07/24 0701 - 07/25 0700 In: 970 [P.O.:970] Out: 1835 [Urine:1545; Chest Tube:290] Intake/Output this shift: Total I/O In: 240 [P.O.:240] Out: 400 [Urine:400]  General appearance: alert, cooperative, distracted and no distress Heart: regular rate and rhythm and occas extrasystole Lungs: dim in bases Abdomen: benign Extremities: no edema Wound: dressings clean  Lab Results: Recent Labs    12/31/19 0451 01/01/20 0313  WBC 13.9* 10.2  HGB 12.4* 12.1*  HCT 36.1* 36.2*  PLT 361 342   BMET:  Recent Labs    12/31/19 0451 01/01/20 0313  NA 135 136  K 3.8 4.3  CL 103 102  CO2 24 27  GLUCOSE 124* 91  BUN 7* 7*  CREATININE 0.89 0.86  CALCIUM 8.7* 8.6*    PT/INR: No results for input(s): LABPROT, INR in the last 72 hours. ABG    Component Value Date/Time   PHART 7.444 12/31/2019 0455   HCO3 25.3 12/31/2019 0455   ACIDBASEDEF 1.2 12/28/2019 1117   O2SAT 94.9 12/31/2019 0455   CBG (last 3)  No results for input(s): GLUCAP in the last 72 hours.  Meds Scheduled Meds: . acetaminophen  1,000 mg Oral Q6H   Or  . acetaminophen (TYLENOL) oral liquid 160 mg/5 mL  1,000 mg Oral Q6H  .  bisacodyl  10 mg Oral Daily  . Chlorhexidine Gluconate Cloth  6 each Topical Daily  . enoxaparin (LOVENOX) injection  40 mg Subcutaneous QHS  . PARoxetine  10 mg Oral Daily  . senna-docusate  1 tablet Oral QHS   Continuous Infusions: . sodium chloride     PRN Meds:.Place/Maintain arterial line **AND** sodium chloride, ALPRAZolam, ondansetron (ZOFRAN) IV, oxyCODONE, traMADol  Xrays DG CHEST PORT 1 VIEW  Result Date: 12/31/2019 CLINICAL DATA:  Status post robotic assisted thoracoscopy EXAM: PORTABLE CHEST 1 VIEW COMPARISON:  12/30/2019 FINDINGS: The right chest tube is in place without visible pneumothorax. Stable mild cardiac enlargement. Decreased lung volumes are noted with small bilateral pleural effusions. No airspace consolidation or interstitial edema. IMPRESSION: 1. No pneumothorax. 2. Small bilateral pleural effusions. Electronically Signed   By: Kerby Moors M.D.   On: 12/31/2019 09:54   DG Chest Port 1 View  Result Date: 12/30/2019 CLINICAL DATA:  79 year old male with history of PleurX catheter insertion. EXAM: PORTABLE CHEST 1 VIEW COMPARISON:  Chest x-ray 12/27/2019. FINDINGS: New right-sided chest tubes in position with tips projecting over the upper right hemithorax and in the right base. Small residual right pleural effusion. No appreciable pneumothorax. Small amount of gas in the right chest wall. Enlarging moderate left pleural effusion. Opacity at the left base may reflect associated subsegmental atelectasis. No evidence of pulmonary edema. Heart size  appears borderline enlarged. Upper mediastinal contours are within normal limits. Orthopedic fixation hardware in the lower cervical spine incidentally noted. IMPRESSION: 1. New right-sided chest tubes with decreased size of what is now a small right pleural effusion. 2. Enlarging moderate left pleural effusion with probable subsegmental atelectasis in the left lung base. Electronically Signed   By: Vinnie Langton M.D.   On:  12/30/2019 15:30    Assessment/Plan: S/P Procedure(s) (LRB): XI ROBOTIC ASSISTED THORACOSCOPY WITH MULTIPLE PLEURAL BIOPSIES (Right) INSERTION PLEURAL DRAINAGE CATHETER (PLEUREX) (Right) TALC PLEURADESIS (Right) VIDEO BRONCHOSCOPY (N/A) INTERCOSTAL NERVE BLOCK (Right)  1 afeb, VSS, sats ok on RA 2 CT 290cc yesterday-poss remove today- will put on H2O seal, has pleurx  3 labs stable 4 poss home in am  LOS: 2 days    Ryan Giovanni PA-C Pager [phone] 01/01/2020  d/c chest tube today Drain pleurix tomorrow and daily  Poss home tomorrow Final path pending  I have seen and examined Ryan Richards and agree with the above assessment  and plan.  Grace Isaac MD Beeper [phone] Office [phone] 01/01/2020 11:36 AM

## 2020-01-01 NOTE — Plan of Care (Signed)
  Problem: Education: Goal: Knowledge of disease or condition will improve Outcome: Progressing Goal: Knowledge of the prescribed therapeutic regimen will improve Outcome: Progressing   Problem: Activity: Goal: Risk for activity intolerance will decrease Outcome: Progressing   Problem: Cardiac: Goal: Will achieve and/or maintain hemodynamic stability Outcome: Progressing   Problem: Clinical Measurements: Goal: Postoperative complications will be avoided or minimized Outcome: Progressing

## 2020-01-01 NOTE — Progress Notes (Signed)
Pt tolerated chest tube removal procedure, after that ambulated in a hallway without SOB and chest pain but complained little wear out after he walked, vitals stable, wife updated, pleurex cathter is in site and dressing is CDI, pleural drainage catheter is ordered and placed in bed side for tomorrow am for the drainage procedure, care transition is on board for possible DC tomorrow, pt is aware, will continue to monitor the patient  Palma Holter, RN

## 2020-01-02 ENCOUNTER — Inpatient Hospital Stay (HOSPITAL_COMMUNITY): Payer: Medicare Other

## 2020-01-02 ENCOUNTER — Ambulatory Visit: Payer: Medicare Other | Admitting: Primary Care

## 2020-01-02 LAB — PATHOLOGIST SMEAR REVIEW

## 2020-01-02 MED ORDER — ALUM & MAG HYDROXIDE-SIMETH 200-200-20 MG/5ML PO SUSP: 30.0000 mL | Freq: Once | ORAL | Status: DC

## 2020-01-02 MED ORDER — PANTOPRAZOLE SODIUM 40 MG PO TBEC: 40.0000 mg | DELAYED_RELEASE_TABLET | Freq: Every day | ORAL | Status: DC

## 2020-01-02 MED ORDER — TRAMADOL HCL 50 MG PO TABS: 50.0000 mg | ORAL_TABLET | Freq: Four times a day (QID) | ORAL | 0 refills | Status: AC | PRN

## 2020-01-02 NOTE — Progress Notes (Addendum)
Drained 16ml off R pleurx drain this AM. Spouse observed technique. Box of pleurX drains ordered to room. Will continue to monitor and ambulate shortly.

## 2020-01-02 NOTE — Plan of Care (Signed)
  Problem: Education: Goal: Knowledge of disease or condition will improve 01/02/2020 1331 by Domingo Sep, RN Outcome: Adequate for Discharge 01/02/2020 1331 by Domingo Sep, RN Outcome: Adequate for Discharge Goal: Knowledge of the prescribed therapeutic regimen will improve 01/02/2020 1331 by Domingo Sep, RN Outcome: Adequate for Discharge 01/02/2020 1331 by Domingo Sep, RN Outcome: Adequate for Discharge   Problem: Activity: Goal: Risk for activity intolerance will decrease 01/02/2020 1331 by Domingo Sep, RN Outcome: Adequate for Discharge 01/02/2020 1331 by Domingo Sep, RN Outcome: Adequate for Discharge   Problem: Cardiac: Goal: Will achieve and/or maintain hemodynamic stability 01/02/2020 1331 by Domingo Sep, RN Outcome: Adequate for Discharge 01/02/2020 1331 by Domingo Sep, RN Outcome: Adequate for Discharge   Problem: Clinical Measurements: Goal: Postoperative complications will be avoided or minimized 01/02/2020 1331 by Domingo Sep, RN Outcome: Adequate for Discharge 01/02/2020 1331 by Domingo Sep, RN Outcome: Adequate for Discharge   Problem: Respiratory: Goal: Respiratory status will improve 01/02/2020 1331 by Domingo Sep, RN Outcome: Adequate for Discharge 01/02/2020 1331 by Domingo Sep, RN Outcome: Adequate for Discharge   Problem: Pain Management: Goal: Pain level will decrease 01/02/2020 1331 by Domingo Sep, RN Outcome: Adequate for Discharge 01/02/2020 1331 by Domingo Sep, RN Outcome: Adequate for Discharge   Problem: Skin Integrity: Goal: Wound healing without signs and symptoms infection will improve 01/02/2020 1331 by Domingo Sep, RN Outcome: Adequate for Discharge 01/02/2020 1331 by Domingo Sep, RN Outcome: Adequate for Discharge   Discharge instructions given to patient. Questions answered. Educated on new medication regimen, signs/symptoms,  restrictions, and follow up appointments. Prescriptions sent to pharmacy. PIV DC, hemostasis achieved. Vital signs stable. All belongings sent home with patient.   Pt escorted by RN via wheelchair to private vehicle driven by spouse.

## 2020-01-02 NOTE — TOC Transition Note (Addendum)
Transition of Care [facility]) - CM/SW Discharge Note   Patient Details  Name: Ryan Richards MRN: [id number] Date of Birth: 08-10-1940  [facility]) CM/SW Contact:  Zenon Mayo, RN Phone Number: 01/02/2020, 2:12 PM   Clinical Narrative:    NCM spoke with patient, offered choice, he states he does not have a preference,  NCM made referral to [facility] with [facility].  Awaiting call back to see if he can take referral for [facility] for 3x/week pleurx drains.  [facility]mi Rumps called back states they can not take referral they do not have the staff.  14:30- NCM tried to call patient back and patient has been discharged without a HHRN set up for pleurx drains.  NCM contacted RN Lilia Pro to inform her that [facility] is not set up yet and patient should not have been discharged til checked with NCM.  NCM checked with [facility] to see if they could take this referral for [facility] for pleurx drains. They are not able to take referral.  NCM contacted [facility] to see if they can take referral.  Awaiting call back. [facility] states they can not take referral.  NCM made referral to [facility] with [facility]. Awaiting call back.  NCM received call from [place] with [facility] she states they can see patient at the earliest late Wed , early Thursday.    15:31- Per Parks Neptune PA, pleurx will be drained every other day, and patient should be ok with the [facility] coming out on Wed.   Final next level of care: [place] Barriers to Discharge: No Barriers Identified   Patient Goals and CMS Choice Patient states their goals for this hospitalization and ongoing recovery are:: get better CMS Medicare.gov Compare Post Acute Care list provided to:: Patient Choice offered to / list presented to : Patient  Discharge Placement                       Discharge Plan and Services                  DME Agency: NA       HH Arranged: RN [facility] Agency: [place] Date [facility] Agency Contacted: 01/02/20 Time [place]: [phone] Representative spoke with at [place]: [place] Interventions     Readmission Risk Interventions No flowsheet data found.

## 2020-01-02 NOTE — Discharge Instructions (Signed)
  Discharge Instructions:  1. You may shower, please wash incisions daily with soap and water and keep dry.  If you wish to cover wounds with dressing you may do so but please keep clean and change daily.  No tub baths or swimming until incisions have completely healed.  If your incisions become red or develop any drainage please call our office at [phone]  2. No Driving until cleared by Dr. Everrett Coombe office and you are no longer using narcotic pain medications  3. Monitor your weight daily.. Please use the same scale and weigh at same time... If you gain 5-10 lbs in 48 hours with associated lower extremity swelling, please contact our office at [phone]  4. Fever of 101.5 for at least 24 hours with no source, please contact our office at [phone]  5. Activity- up as tolerated, please walk at least 3 times per day.  Avoid strenuous activity, no lifting, pushing, or pulling with your arms over 8-10 lbs for a minimum of 6 weeks  6. If any questions or concerns arise, please do not hesitate to contact our office at [phone]

## 2020-01-02 NOTE — TOC Transition Note (Addendum)
Transition of Care [facility]) - CM/SW Discharge Note   Patient Details  Name: Ryan Richards MRN: [id number] Date of Birth: 1941/05/11  Transition of Care [facility]) CM/SW Contact:  Zenon Mayo, RN Phone Number: 01/02/2020, 3:37 PM   Clinical Narrative:     NCM spoke with patient, offered choice, he states he does not have a preference,  NCM made referral to [facility] with [facility].  Awaiting call back to see if he can take referral for [facility] for 3x/week pleurx drains.  Tommi Rumps called back states they can not take referral they do not have the staff.  14:30- NCM tried to call patient back and patient has been discharged without a HHRN set up for pleurx drains.  NCM contacted RN Lilia Pro to inform her that [facility] is not set up yet and patient should not have been discharged til checked with NCM.  NCM checked with [facility] to see if they could take this referral for [facility] for pleurx drains. They are not able to take referral.  NCM contacted [facility] to see if they can take referral.  Awaiting call back. [facility] states they can not take referral.  NCM made referral to [facility] with [facility]. Awaiting call back.  NCM received call from [place] with [facility] she states they can see patient at the earliest late Wed , early Thursday.    15:31- Per Parks Neptune PA, pleurx will be drained every other day, and patient should be ok with the [facility] coming out on Wed. NCM contacted wife, informed her that the [facility] agency will be out on Wed or Thursday, and gave her Malachy Mood with [facility] phone number.   Final next level of care: [place] Barriers to Discharge: No Barriers Identified   Patient Goals and CMS Choice Patient states their goals for this hospitalization and ongoing recovery are:: get better CMS Medicare.gov Compare Post Acute Care list provided to:: Patient Choice offered to / list presented to : Patient  Discharge Placement                       Discharge Plan and Services                   DME Agency: NA       HH Arranged: RN [facility] Agency: [place] Date [place]: 01/02/20 Time [place]: 4401 Representative spoke with at [place]: [place] Interventions     Readmission Risk Interventions No flowsheet data found.

## 2020-01-02 NOTE — Progress Notes (Addendum)
      [address]       [place] [address]             [phone]      3 Days Post-Op Procedure(s) (LRB): XI ROBOTIC ASSISTED THORACOSCOPY WITH MULTIPLE PLEURAL BIOPSIES (Right) INSERTION PLEURAL DRAINAGE CATHETER (PLEUREX) (Right) TALC PLEURADESIS (Right) VIDEO BRONCHOSCOPY (N/A) INTERCOSTAL NERVE BLOCK (Right) Subjective: Awake and alert, says he is comfortable and not requiring any pain medication.  Remains on RA with adequate O2 sats.  BM yesterday.  He feels he is ready to return home.    Objective: Vital signs in last 24 hours: Temp:  [97.6 F (36.4 C)-98.4 F (36.9 C)] 97.9 F (36.6 C) (07/26 0432) Pulse Rate:  [71-88] 86 (07/26 0432) Cardiac Rhythm: Normal sinus rhythm (07/26 0400) Resp:  [18-22] 20 (07/26 0432) BP: (111-149)/(70-98) 138/97 (07/26 0432) SpO2:  [94 %-96 %] 95 % (07/26 0432)    Intake/Output from previous day: 07/25 0701 - 07/26 0700 In: 720 [P.O.:720] Out: 1800 [Urine:1800] Intake/Output this shift: No intake/output data recorded.  General appearance: alert, cooperative and no distress Neurologic: intact Heart: RRR Lungs: Breath sounds clear, diminished on Rt.  Abdomen: Soft and NT, active bowel sounds. Extremities: No edema or LE tenderness. Wound: right chest port sites dry.   Lab Results: Recent Labs    12/31/19 0451 01/01/20 0313  WBC 13.9* 10.2  HGB 12.4* 12.1*  HCT 36.1* 36.2*  PLT 361 342   BMET:  Recent Labs    12/31/19 0451 01/01/20 0313  NA 135 136  K 3.8 4.3  CL 103 102  CO2 24 27  GLUCOSE 124* 91  BUN 7* 7*  CREATININE 0.89 0.86  CALCIUM 8.7* 8.6*    PT/INR: No results for input(s): LABPROT, INR in the last 72 hours. ABG    Component Value Date/Time   PHART 7.444 12/31/2019 0455   HCO3 25.3 12/31/2019 0455   ACIDBASEDEF 1.2 12/28/2019 1117   O2SAT 94.9 12/31/2019 0455   CBG (last 3)  No results for input(s): GLUCAP in the last 72 hours.  Assessment/Plan: S/P Procedure(s)  (LRB): XI ROBOTIC ASSISTED THORACOSCOPY WITH MULTIPLE PLEURAL BIOPSIES (Right) INSERTION PLEURAL DRAINAGE CATHETER (PLEUREX) (Right) TALC PLEURADESIS (Right) VIDEO BRONCHOSCOPY (N/A) INTERCOSTAL NERVE BLOCK (Right)  -POD3 right robot-assisted drainage of pleural effusion, pleural BX, talc pleurodesis, and PleurX catheter placement. CT removed yesterday.  Chest x-ray stable post CT removal yesterday, minimal change in small right pleural effusion.  Plan for drainage of the PleurX catheter today to assure proper function and then discharge to home.  Path is pending.     LOS: 3 days    Antony Odea, Hershal Coria [phone] 01/02/2020  Orders for daily drainage of pleurix placed  Path still pending  Have contacted medical oncology to arrange outpatient appointment  Home today I have seen and examined Lerry Paterson Carline and agree with the above assessment  and plan.  Grace Isaac MD Beeper [phone] Office [phone] 01/02/2020 9:18 AM

## 2020-01-03 ENCOUNTER — Other Ambulatory Visit: Payer: Self-pay | Admitting: Cardiothoracic Surgery

## 2020-01-03 DIAGNOSIS — J90 Pleural effusion, not elsewhere classified: Secondary | ICD-10-CM

## 2020-01-04 ENCOUNTER — Telehealth: Payer: Self-pay | Admitting: *Deleted

## 2020-01-04 DIAGNOSIS — J910 Malignant pleural effusion: Secondary | ICD-10-CM

## 2020-01-04 LAB — CYTOLOGY - NON PAP

## 2020-01-04 NOTE — Telephone Encounter (Signed)
I received referral on Ryan Richards 7/26.  I called to schedule him to be seen with med onc on 01/12/20 at [facility].

## 2020-01-06 ENCOUNTER — Ambulatory Visit (HOSPITAL_COMMUNITY): Payer: Medicare Other

## 2020-01-09 DIAGNOSIS — I10 Essential (primary) hypertension: Secondary | ICD-10-CM | POA: Diagnosis not present

## 2020-01-09 DIAGNOSIS — K219 Gastro-esophageal reflux disease without esophagitis: Secondary | ICD-10-CM | POA: Diagnosis not present

## 2020-01-09 DIAGNOSIS — Z483 Aftercare following surgery for neoplasm: Secondary | ICD-10-CM | POA: Diagnosis not present

## 2020-01-09 DIAGNOSIS — Z438 Encounter for attention to other artificial openings: Secondary | ICD-10-CM | POA: Diagnosis not present

## 2020-01-09 DIAGNOSIS — J986 Disorders of diaphragm: Secondary | ICD-10-CM | POA: Diagnosis not present

## 2020-01-09 DIAGNOSIS — M4802 Spinal stenosis, cervical region: Secondary | ICD-10-CM | POA: Diagnosis not present

## 2020-01-09 DIAGNOSIS — C384 Malignant neoplasm of pleura: Secondary | ICD-10-CM | POA: Diagnosis not present

## 2020-01-09 DIAGNOSIS — G588 Other specified mononeuropathies: Secondary | ICD-10-CM | POA: Diagnosis not present

## 2020-01-09 DIAGNOSIS — K2270 Barrett's esophagus without dysplasia: Secondary | ICD-10-CM | POA: Diagnosis not present

## 2020-01-09 DIAGNOSIS — J910 Malignant pleural effusion: Secondary | ICD-10-CM | POA: Diagnosis not present

## 2020-01-11 ENCOUNTER — Ambulatory Visit (HOSPITAL_COMMUNITY): Admission: RE | Admit: 2020-01-11 | Payer: Medicare Other | Source: Ambulatory Visit

## 2020-01-11 ENCOUNTER — Encounter (HOSPITAL_COMMUNITY)
Admission: RE | Admit: 2020-01-11 | Discharge: 2020-01-11 | Disposition: A | Discharge status: Home / Self Care | Payer: Medicare Other | Source: Ambulatory Visit | Attending: Cardiothoracic Surgery | Admitting: Cardiothoracic Surgery

## 2020-01-11 ENCOUNTER — Other Ambulatory Visit: Payer: Self-pay

## 2020-01-11 DIAGNOSIS — K8689 Other specified diseases of pancreas: Secondary | ICD-10-CM | POA: Diagnosis not present

## 2020-01-11 DIAGNOSIS — J90 Pleural effusion, not elsewhere classified: Secondary | ICD-10-CM | POA: Insufficient documentation

## 2020-01-11 DIAGNOSIS — C384 Malignant neoplasm of pleura: Secondary | ICD-10-CM | POA: Diagnosis not present

## 2020-01-11 DIAGNOSIS — I700 Atherosclerosis of aorta: Secondary | ICD-10-CM | POA: Diagnosis not present

## 2020-01-11 LAB — GLUCOSE, CAPILLARY: Glucose-Capillary: 118 mg/dL — ABNORMAL HIGH (ref 70–99)

## 2020-01-11 MED ORDER — FLUDEOXYGLUCOSE F - 18 (FDG) INJECTION
10.7400 | Freq: Once | INTRAVENOUS | Status: AC | PRN
  Administered 2020-01-11: 10.74 via INTRAVENOUS

## 2020-01-12 ENCOUNTER — Other Ambulatory Visit: Payer: Self-pay

## 2020-01-12 ENCOUNTER — Inpatient Hospital Stay: Payer: Medicare Other | Attending: Internal Medicine

## 2020-01-12 ENCOUNTER — Inpatient Hospital Stay (HOSPITAL_BASED_OUTPATIENT_CLINIC_OR_DEPARTMENT_OTHER): Payer: Medicare Other | Admitting: Internal Medicine

## 2020-01-12 ENCOUNTER — Encounter: Payer: Self-pay | Admitting: Internal Medicine

## 2020-01-12 DIAGNOSIS — I10 Essential (primary) hypertension: Secondary | ICD-10-CM

## 2020-01-12 DIAGNOSIS — Z77090 Contact with and (suspected) exposure to asbestos: Secondary | ICD-10-CM

## 2020-01-12 DIAGNOSIS — R5383 Other fatigue: Secondary | ICD-10-CM

## 2020-01-12 DIAGNOSIS — C384 Malignant neoplasm of pleura: Secondary | ICD-10-CM | POA: Insufficient documentation

## 2020-01-12 DIAGNOSIS — C3490 Malignant neoplasm of unspecified part of unspecified bronchus or lung: Secondary | ICD-10-CM

## 2020-01-12 DIAGNOSIS — J910 Malignant pleural effusion: Secondary | ICD-10-CM

## 2020-01-12 DIAGNOSIS — C3491 Malignant neoplasm of unspecified part of right bronchus or lung: Secondary | ICD-10-CM

## 2020-01-12 DIAGNOSIS — Z800 Family history of malignant neoplasm of digestive organs: Secondary | ICD-10-CM

## 2020-01-12 DIAGNOSIS — F410 Panic disorder [episodic paroxysmal anxiety] without agoraphobia: Secondary | ICD-10-CM

## 2020-01-12 DIAGNOSIS — Z87891 Personal history of nicotine dependence: Secondary | ICD-10-CM

## 2020-01-12 DIAGNOSIS — Z5111 Encounter for antineoplastic chemotherapy: Secondary | ICD-10-CM | POA: Insufficient documentation

## 2020-01-12 DIAGNOSIS — Z7189 Other specified counseling: Secondary | ICD-10-CM

## 2020-01-12 DIAGNOSIS — K219 Gastro-esophageal reflux disease without esophagitis: Secondary | ICD-10-CM

## 2020-01-12 DIAGNOSIS — J449 Chronic obstructive pulmonary disease, unspecified: Secondary | ICD-10-CM | POA: Diagnosis not present

## 2020-01-12 DIAGNOSIS — Z5112 Encounter for antineoplastic immunotherapy: Secondary | ICD-10-CM

## 2020-01-12 LAB — CBC WITH DIFFERENTIAL (CANCER CENTER ONLY)
Abs Immature Granulocytes: 0.02 10*3/uL (ref 0.00–0.07)
Basophils Absolute: 0.1 10*3/uL (ref 0.0–0.1)
Basophils Relative: 1 %
Eosinophils Absolute: 0.1 10*3/uL (ref 0.0–0.5)
Eosinophils Relative: 1 %
HCT: 42.5 % (ref 39.0–52.0)
Hemoglobin: 13.9 g/dL (ref 13.0–17.0)
Immature Granulocytes: 0 %
Lymphocytes Relative: 20 %
Lymphs Abs: 1.7 10*3/uL (ref 0.7–4.0)
MCH: 29.7 pg (ref 26.0–34.0)
MCHC: 32.7 g/dL (ref 30.0–36.0)
MCV: 90.8 fL (ref 80.0–100.0)
Monocytes Absolute: 0.8 10*3/uL (ref 0.1–1.0)
Monocytes Relative: 9 %
Neutro Abs: 5.6 10*3/uL (ref 1.7–7.7)
Neutrophils Relative %: 69 %
Platelet Count: 477 10*3/uL — ABNORMAL HIGH (ref 150–400)
RBC: 4.68 MIL/uL (ref 4.22–5.81)
RDW: 12 % (ref 11.5–15.5)
WBC Count: 8.1 10*3/uL (ref 4.0–10.5)
nRBC: 0 % (ref 0.0–0.2)

## 2020-01-12 LAB — CMP (CANCER CENTER ONLY)
ALT: 15 U/L (ref 0–44)
AST: 17 U/L (ref 15–41)
Albumin: 2.7 g/dL — ABNORMAL LOW (ref 3.5–5.0)
Alkaline Phosphatase: 84 U/L (ref 38–126)
Anion gap: 8 (ref 5–15)
BUN: 15 mg/dL (ref 8–23)
CO2: 25 mmol/L (ref 22–32)
Calcium: 10.1 mg/dL (ref 8.9–10.3)
Chloride: 104 mmol/L (ref 98–111)
Creatinine: 0.77 mg/dL (ref 0.61–1.24)
GFR, Est AFR Am: >60 mL/min (ref >60)
GFR, Estimated: >60 mL/min (ref >60)
Glucose, Bld: 109 mg/dL — ABNORMAL HIGH (ref 70–99)
Potassium: 3.8 mmol/L (ref 3.5–5.1)
Sodium: 137 mmol/L (ref 135–145)
Total Bilirubin: 0.3 mg/dL (ref 0.3–1.2)
Total Protein: 7.1 g/dL (ref 6.5–8.1)

## 2020-01-12 MED ORDER — LIDOCAINE-PRILOCAINE 2.5-2.5 % EX CREA
TOPICAL_CREAM | CUTANEOUS | 0 refills | Status: DC
Start: 2020-01-12 — End: 2020-01-26

## 2020-01-12 MED ORDER — PROCHLORPERAZINE MALEATE 10 MG PO TABS
10.0000 mg | ORAL_TABLET | Freq: Four times a day (QID) | ORAL | 0 refills | Status: DC | PRN
Start: 2020-01-12 — End: 2020-01-26

## 2020-01-12 NOTE — Progress Notes (Signed)
[place] [phone]   [phone] Multidisciplinary thoracic oncology clinic      CONSULT NOTE  REFERRING PHYSICIAN: Dr. Lanelle Richards  REASON FOR CONSULTATION:  79 years old white male recently diagnosed with lung cancer.  HPI Ryan Richards is a 79 y.o. male with past medical history significant for hypertension, COPD, this arrhythmia, GERD, panic attack as well as long history of smoking but quit in 2000.  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  The patient mentioned that he has been complaining of shortness of breath since end of June 2021.  He was seen at the emergency department at [facility] in [facility] where chest x-ray showed right-sided pleural effusion.  The patient was transferred to [facility] for further evaluation.  He underwent ultrasound-guided right thoracentesis on 12/13/2019 with drainage of 450 mL of pleural fluid.  The cytology showed mesothelial cells.  CT angiogram of the chest on 12/19/2019 showed large right pleural effusion with associated subtotal collapse of the right lower lobe.  There was interval development of trace groundglass pulmonary infiltrate within the left lower lobe suspicious for edema versus infection/inflammation and no evidence for pulmonary embolism.  On 12/23/2019 the patient underwent repeat ultrasound-guided right thoracentesis with drainage of 1500 cc of serosanguineous fluid.  The cytology again showed no malignant cells.  On December 30, 2019 the patient was seen by Dr. Servando Richards and he underwent right video bronchoscopy with brushing of the superior segment and right lower lobe, right VATS with drainage of pleural effusion as well as multiple pleural biopsies, talc pleurodesis and placement of Pleurx catheter. The final pathology [id number]) showed poorly differentiated malignancy. The neoplastic cells are positive for cytokeratin AE1/3 and  D2-40 but negative for cytokeratin 7, cytokeratin 5/6, p40, WT 1, calretinin,  moc-31 and TTF-1. Overall the findings are consistent with a poorly differentiated malignant neoplasm and feature slightly favor a poorly differentiated sarcomatoid carcinoma. However, mesothelioma is not  entirely excluded.  Dr. Servando Richards kindly referred the patient to me today for evaluation and recommendation regarding treatment of his condition. When seen today the patient is complaining of increasing fatigue but he feels better today.  He has shortness of breath at baseline increased with exertion with cough.  He has no chest pain or hemoptysis.  He has panic attacks.  He denied having any nausea, vomiting, diarrhea or constipation.  He has occasional visual changes but only with the panic attack.  He lost around 20 pounds in the last 3 months secondary to lack of appetite. Family history significant for mother with colon cancer.  Father died from aneurysm. The patient is married and has no children.  He used to work as a Theme park manager and has asbestos exposure during his work and also in [employer].  He was accompanied today by his wife Ryan Richards and his friend Ryan Richards.  The patient has a history of smoking less than 1 pack/day for around 40 years and quit in 2000.  He has no history of alcohol or drug abuse.  HPI  Past Medical History:  Diagnosis Date  . Ambulates with cane    "just since 11/2019"  . Arthritis   . Bronchitis   . COPD (chronic obstructive pulmonary disease) ([place])   . Dyspnea   . Dysrhythmia 11/2019   pt was told "a-fib and was normal for his age", recheck in 1 yr  . Essential hypertension   .  GERD (gastroesophageal reflux disease)   . Hearing loss    lett ear - no hearing aids  . History of pneumonia 2015  . Panic attacks   . Prostate cancer ([place])   . Urinary frequency   . Wears glasses     Past Surgical History:  Procedure Laterality Date  . ANTERIOR CERVICAL DECOMP/DISCECTOMY FUSION N/A  03/20/2015   Procedure: C4-5 C5-6 C6-7 Anterior cervical decompression/diskectomy/fusion;  Surgeon: Ryan Larsson, MD;  Location: [place] [facility];  Service: Neurosurgery;  Laterality: N/A;  C4-5 C5-6 C6-7 Anterior cervical decompression/diskectomy/fusion  . BACK SURGERY     x4  . CARDIAC CATHETERIZATION  1992   pt. states that everything was fine  . CHEST TUBE INSERTION Right 12/30/2019   Procedure: INSERTION PLEURAL DRAINAGE CATHETER (PLEUREX);  Surgeon: Ryan Isaac, MD;  Location: [place];  Service: Thoracic;  Laterality: Right;  . COLONOSCOPY  2012   Through [facility] in [place].  Marland Kitchen COLONOSCOPY N/A 01/13/2019   Procedure: COLONOSCOPY;  Surgeon: Ryan Houston, MD;  [facility];  Service: Endoscopy;  Laterality: N/A;  730  . ESOPHAGOGASTRODUODENOSCOPY N/A 08/23/2015   Procedure: ESOPHAGOGASTRODUODENOSCOPY (EGD);  Surgeon: Ryan Houston, MD;  [facility];  Service: Endoscopy;  Laterality: N/A;  1:45  . INTERCOSTAL NERVE BLOCK Right 12/30/2019   Procedure: INTERCOSTAL NERVE BLOCK;  Surgeon: Ryan Isaac, MD;  Location: [place];  Service: Thoracic;  Laterality: Right;  . KNEE SURGERY Left 1972  . POLYPECTOMY  01/13/2019   Procedure: POLYPECTOMY;  Surgeon: Ryan Houston, MD;  [facility];  Service: Endoscopy;;  sigmoid colon  . PROSTATE SURGERY     removal  . TALC PLEURODESIS Right 12/30/2019   Procedure: Ryan Richards;  Surgeon: Ryan Isaac, MD;  Location: [place];  Service: Thoracic;  Laterality: Right;  Marland Kitchen VIDEO BRONCHOSCOPY N/A 12/30/2019   Procedure: VIDEO BRONCHOSCOPY;  Surgeon: Ryan Isaac, MD;  Location: [facility];  Service: Thoracic;  Laterality: N/A;  . XI ROBOTIC ASSISTED THORACOSCOPY PLEURECTOMY Right 12/30/2019   Procedure: XI ROBOTIC ASSISTED THORACOSCOPY WITH MULTIPLE PLEURAL BIOPSIES;  Surgeon: Ryan Isaac, MD;  Location: [place];  Service: Thoracic;  Laterality: Right;    Family History  Problem Relation Age of Onset  . Colon  cancer Mother   . Arthritis Father   . Aneurysm Father   . Hiatal hernia Sister   . Hepatitis C Brother   . Healthy Sister   . Healthy Sister   . Hiatal hernia Sister   . Healthy Sister   . Healthy Sister   . Heart disease Maternal Aunt     Social History Social History   Tobacco Use  . Smoking status: Former Smoker    Packs/day: 1.00    Years: 40.00    Pack years: 40.00    Quit date: 06/09/1997    Years since quitting: 22.6  . Smokeless tobacco: Never Used  Vaping Use  . Vaping Use: Never used  Substance Use Topics  . Alcohol use: No    Alcohol/week: 0.0 standard drinks  . Drug use: No    Allergies  Allergen Reactions  . Codeine     Itch, and bad headache  . Morphine And Related     Itch, and bad headache     Current Outpatient Medications  Medication Sig Dispense Refill  . acetaminophen (TYLENOL) 500 MG tablet Take 1,000 mg by mouth every 6 (six) hours as needed for moderate pain.    Marland Kitchen  albuterol (PROVENTIL HFA;VENTOLIN HFA) 108 (90 BASE) MCG/ACT inhaler Inhale 2 puffs into the lungs every 6 (six) hours as needed for wheezing or shortness of breath.    Marland Kitchen albuterol (PROVENTIL) (2.5 MG/3ML) 0.083% nebulizer solution Inhale 3 mLs into the lungs in the morning and at bedtime.     . ALPRAZolam (XANAX) 0.25 MG tablet Take 0.5 tablets by mouth at bedtime.    Marland Kitchen amLODipine (NORVASC) 5 MG tablet Take 5 mg by mouth daily.    . cyanocobalamin 1000 MCG tablet Take 1,000 mcg by mouth daily. Takes 2 daily    . ibuprofen (ADVIL) 400 MG tablet Take 400 mg by mouth every 6 (six) hours as needed.     . Misc Natural Products (OSTEO BI-FLEX ADV TRIPLE ST PO) Take 2 capsules by mouth daily.    Marland Kitchen omeprazole (PRILOSEC) 20 MG capsule Take 20 mg by mouth daily.    Marland Kitchen PARoxetine (PAXIL) 10 MG tablet Take 10 mg by mouth daily. Started on 12/22/19     No current facility-administered medications for this visit.    Review of Systems  Constitutional: positive for fatigue and weight  loss Eyes: negative Ears, nose, mouth, throat, and face: negative Respiratory: positive for dyspnea on exertion Cardiovascular: negative Gastrointestinal: negative Genitourinary:negative Integument/breast: negative Hematologic/lymphatic: negative Musculoskeletal:negative Neurological: negative Behavioral/Psych: negative Endocrine: negative Allergic/Immunologic: negative  Physical Exam  WVP:XTGGY, healthy, no distress, well nourished, well developed and anxious SKIN: skin color, texture, turgor are normal, no rashes or significant lesions HEAD: Normocephalic, No masses, lesions, tenderness or abnormalities EYES: normal, PERRLA, Conjunctiva are pink and non-injected EARS: External ears normal, Canals clear OROPHARYNX:no exudate, no erythema and lips, buccal mucosa, and tongue normal  NECK: supple, no adenopathy, no JVD LYMPH:  no palpable lymphadenopathy, no hepatosplenomegaly LUNGS: decreased breath sounds HEART: regular rate & rhythm, no murmurs and no gallops ABDOMEN:abdomen soft, non-tender, normal bowel sounds and no masses or organomegaly BACK: No CVA tenderness, Range of motion is normal EXTREMITIES:no joint deformities, effusion, or inflammation, no edema  NEURO: alert & oriented x 3 with fluent speech, no focal motor/sensory deficits  PERFORMANCE STATUS: ECOG 1  LABORATORY DATA: Lab Results  Component Value Date   WBC 8.1 01/12/2020   HGB 13.9 01/12/2020   HCT 42.5 01/12/2020   MCV 90.8 01/12/2020   PLT 477 (H) 01/12/2020      Chemistry      Component Value Date/Time   NA 136 01/01/2020 0313   K 4.3 01/01/2020 0313   CL 102 01/01/2020 0313   CO2 27 01/01/2020 0313   BUN 7 (L) 01/01/2020 0313   CREATININE 0.86 01/01/2020 0313   CREATININE 1.14 02/22/2019 1125      Component Value Date/Time   CALCIUM 8.6 (L) 01/01/2020 0313   ALKPHOS 56 01/01/2020 0313   AST 14 (L) 01/01/2020 0313   ALT 13 01/01/2020 0313   BILITOT 0.7 01/01/2020 0313        RADIOGRAPHIC STUDIES: DG Chest 1 View  Result Date: 12/23/2019 CLINICAL DATA:  Right pleural effusion, status post right thoracentesis EXAM: CHEST  1 VIEW COMPARISON:  Multiple exams, including 12/19/2019 FINDINGS: No pneumothorax. Marked size reduction in the right pneumothorax, currently only with minimal residual blunting the right costophrenic angle. Chronic elevation of the left hemidiaphragm. No significant edema. Degenerative glenohumeral arthropathy bilaterally. IMPRESSION: 1. No pneumothorax, status post right thoracentesis. There is line minimal residual blunting of the right lateral costophrenic angle. 2. Chronic elevation of left hemidiaphragm. Electronically Signed   By: Thayer Jew  Janeece Fitting M.D.   On: 12/23/2019 12:20   DG Chest 2 View  Result Date: 12/27/2019 CLINICAL DATA:  Shortness of breath, pleural effusion. EXAM: CHEST - 2 VIEW COMPARISON:  December 23, 2019. FINDINGS: Stable cardiomegaly. No pneumothorax is noted. Stable elevated left hemidiaphragm is noted. Left lung is clear. Small possibly loculated right pleural effusion is noted with associated right basilar atelectasis. Bony thorax is unremarkable. IMPRESSION: Small possibly loculated right pleural effusion is noted with associated right basilar atelectasis. Electronically Signed   By: Marijo Conception M.D.   On: 12/27/2019 13:59   CT Angio Chest PE W and/or Wo Contrast  Result Date: 12/19/2019 CLINICAL DATA:  Dyspnea, pneumonia, nonproductive cough, weakness EXAM: CT ANGIOGRAPHY CHEST WITH CONTRAST TECHNIQUE: Multidetector CT imaging of the chest was performed using the standard protocol during bolus administration of intravenous contrast. Multiplanar CT image reconstructions and MIPs were obtained to evaluate the vascular anatomy. CONTRAST:  180m OMNIPAQUE IOHEXOL 350 MG/ML SOLN COMPARISON:  12/06/2019 FINDINGS: Cardiovascular: Satisfactory opacification of the pulmonary arteries to the segmental level. No evidence of  pulmonary embolism. Normal heart size. No pericardial effusion. The thoracic aorta is suboptimally opacified, but is unremarkable. Mediastinum/Nodes: No enlarged mediastinal, hilar, or axillary lymph nodes. Thyroid gland, trachea, and esophagus demonstrate no significant findings. Lungs/Pleura: Large right pleural effusion is again identified, similar in size to prior examination, with associated subtotal collapse of the right lower lobe. Mild ground-glass pulmonary infiltrate within the dependent left lower lobe is nonspecific, but could reflect trace pulmonary edema or inflammatory infiltrate. This appears new since prior examination. No pneumothorax. The central airways are widely patent. Upper Abdomen: Limited images of the upper abdomen are unremarkable. Musculoskeletal: Cervical fusion hardware is partially visualized. Degenerative changes are seen within the thoracic spine. No lytic or blastic bone lesions are seen. Review of the MIP images confirms the above findings. IMPRESSION: Large right pleural effusion, similar in size to prior examination, with associated subtotal collapse of the right lower lobe. Interval development of trace ground-glass pulmonary infiltrate within the left lower lobe, edema versus infection/inflammation. No pulmonary embolism Electronically Signed   By: AFidela SalisburyMD   On: 12/19/2019 15:59   NM PET Image Initial (PI) Skull Base To Thigh  Result Date: 01/11/2020 CLINICAL DATA:  Initial treatment strategy for pleural malignancy. EXAM: NUCLEAR MEDICINE PET SKULL BASE TO THIGH TECHNIQUE: 10.74 mCi F-18 FDG was injected intravenously. Full-ring PET imaging was performed from the skull base to thigh after the radiotracer. CT data was obtained and used for attenuation correction and anatomic localization. Fasting blood glucose: 118 mg/dl COMPARISON:  Prior chest CTs and abdomen and pelvis CTs dating back to 2020 FINDINGS: Mediastinal blood pool activity: SUV max 2.90 Liver  activity: SUV max NA NECK: No hypermetabolic lymph nodes in the neck. Incidental CT findings: Atheromatous plaque of the carotid arteries. Extensive artifact from dental fillings and hardware. Artifact from cervical spinal fusion. CHEST: Diffuse FDG uptake involving all pleural surfaces in the RIGHT chest, circumferential rind of marked FDG uptake. (SUVmax = 12.5. This activity is seen along all pleural surfaces as described. There is evidence of talc pleurodesis as well seen along the anterior pleural space on image 94 of series 4 where there is nodular change in the pleura adjacent to evidence of prior pleurodesis. Irregular pleural thickening throughout the chest. Indistinct margin along the mediastinal fat along the LEFT mediastinal border and just above the LEFT heart border also seen on image 94 of series 4 with there is  thickening measuring approximately 12 mm in the medial and anterior pleural space. Nodular changes and thickening up to 13 mm are seen in addition to partial collapse of the RIGHT lower lobe. Some infiltration of the paraspinous fat shows no change since September of 2020 and shows more minimal increased FDG uptake (SUVmax = 2.7 this is best seen along the RIGHT paravertebral region and is most pronounced at the level of T10 this has been present since 2017 and is little changed. Areas of central increased metabolic activity are noted at the RIGHT hilum and at the AP window (image 73, series 4) 8 mm AP window lymph node (SUVmax = 4.1) Small RIGHT paratracheal lymph node (image 75, series 4) 7 mm (SUVmax = 4.8) Similar activity, difficult to measure within subcarinal nodal tissue on image 85 of series 4 which is partially obscured on FDG portion of the exam due to the intense metabolic activity in the medial RIGHT lung base LEFT hilar activity that is focal but difficult to separate from surrounding vasculature likely corresponds to the 13 mm lymph node seen on the study of December 06, 2019  (SUVmax = 5.4) Incidental CT findings: Calcified atheromatous plaque, minimal in the thoracic aorta. No pericardial effusion. RIGHT-sided PleurX catheter in place. Residual pleural effusion in the medial RIGHT chest though overall pleural fluid is markedly diminished compared to previous imaging. No suspicious pulmonary nodule. LEFT chest is clear. ABDOMEN/PELVIS: Focal area of increased metabolic activity the area of the body of the pancreas without ductal dilation and without visible lesion on previous imaging subtle disruption of the pancreatic architecture is suggested however and there is variable enhancement, significance uncertain but raising the question second primary or site of metastasis. (SUVmax = 4.3) (image 118 of series 4) No additional focal area of increased metabolic uptake in the abdomen or pelvis Incidental CT findings: Abdominal aortic atherosclerosis without aneurysmal dilation. SKELETON: No focal hypermetabolic activity to suggest skeletal metastasis. Incidental CT findings: none IMPRESSION: Diffuse circumferential hypermetabolic activity in the RIGHT chest, suspicious for process such as mesothelioma or aggressive metastatic disease. FDG uptake is likely accentuated however by the recent talc pleurodesis. Involvement of mediastinal fat along the anterior and medial RIGHT chest. Mediastinal lymph nodes including AP window lymph nodes that raise the question of mediastinal involvement. Area of subtle but focal FDG uptake in the pancreas in the midportion. Consider endoscopic sonography to exclude the possibility of subtle pancreatic lesion either primary or metastatic. Paraspinal process which is unchanged over a series of multiple prior studies and displays FDG uptake only mildly above mediastinal blood pool, this could represent a benign process such as extra medullary hematopoiesis given lack of change. Attention on follow-up. Aortic Atherosclerosis (ICD10-I70.0). Electronically Signed   By:  Zetta Bills M.D.   On: 01/11/2020 17:25   DG CHEST PORT 1 VIEW  Result Date: 01/02/2020 CLINICAL DATA:  79 year old male status post video-assisted thoracoscopic surgery with biopsy and PleurX catheter placement. EXAM: PORTABLE CHEST 1 VIEW COMPARISON:  Prior chest x-ray 12/31/2019 FINDINGS: Stable cardiac and mediastinal contours. Right-sided tunneled pleural drainage catheter remains in unchanged position. Low inspiratory volumes with patchy bibasilar airspace opacities likely representing atelectasis or scarring. Background bronchitic changes are similar. No pneumothorax. No significant reaccumulation of pleural fluid. No acute osseous abnormality. Surgical changes of prior anterior cervical discectomy and fusion. The right-sided glenohumeral joint osteoarthritis also noted. IMPRESSION: 1. Low inspiratory volumes with right greater than left bibasilar atelectasis. 2. Right-sided tunneled pleural drainage catheter in stable position. No  significant reaccumulation of pleural fluid. 3. Stable chronic bronchitic changes. Electronically Signed   By: Jacqulynn Cadet M.D.   On: 01/02/2020 07:39   DG CHEST PORT 1 VIEW  Result Date: 12/31/2019 CLINICAL DATA:  Status post robotic assisted thoracoscopy EXAM: PORTABLE CHEST 1 VIEW COMPARISON:  12/30/2019 FINDINGS: The right chest tube is in place without visible pneumothorax. Stable mild cardiac enlargement. Decreased lung volumes are noted with small bilateral pleural effusions. No airspace consolidation or interstitial edema. IMPRESSION: 1. No pneumothorax. 2. Small bilateral pleural effusions. Electronically Signed   By: Kerby Moors M.D.   On: 12/31/2019 09:54   DG Chest Port 1 View  Result Date: 12/30/2019 CLINICAL DATA:  79 year old male with history of PleurX catheter insertion. EXAM: PORTABLE CHEST 1 VIEW COMPARISON:  Chest x-ray 12/27/2019. FINDINGS: New right-sided chest tubes in position with tips projecting over the upper right hemithorax and  in the right base. Small residual right pleural effusion. No appreciable pneumothorax. Small amount of gas in the right chest wall. Enlarging moderate left pleural effusion. Opacity at the left base may reflect associated subsegmental atelectasis. No evidence of pulmonary edema. Heart size appears borderline enlarged. Upper mediastinal contours are within normal limits. Orthopedic fixation hardware in the lower cervical spine incidentally noted. IMPRESSION: 1. New right-sided chest tubes with decreased size of what is now a small right pleural effusion. 2. Enlarging moderate left pleural effusion with probable subsegmental atelectasis in the left lung base. Electronically Signed   By: Vinnie Langton M.D.   On: 12/30/2019 15:30   DG Chest Portable 1 View  Result Date: 12/19/2019 CLINICAL DATA:  Shortness of breath with fatigue EXAM: PORTABLE CHEST 1 VIEW COMPARISON:  November 13, 2019 FINDINGS: There is a fairly small loculated right pleural effusion. There is a minimal left pleural effusion. There is atelectatic change in the lung bases. There is no consolidation. Heart size and pulmonary vascularity are normal. No adenopathy. There is postoperative change in the lower cervical region. IMPRESSION: Fairly small right sided loculated pleural effusion with minimal left pleural effusion. Bibasilar atelectasis. Lungs otherwise clear. Stable cardiac silhouette. Electronically Signed   By: Lowella Grip III M.D.   On: 12/19/2019 14:03   US THORACENTESIS ASP PLEURAL SPACE W/IMG GUIDE  Result Date: 12/23/2019 INDICATION: Right pleural effusion EXAM: ULTRASOUND GUIDED RIGHT THORACENTESIS MEDICATIONS: None. COMPLICATIONS: Vagal reaction. PROCEDURE: An ultrasound guided thoracentesis was thoroughly discussed with the patient and questions answered. The benefits, risks, alternatives and complications were also discussed. The patient understands and wishes to proceed with the procedure. Written consent was obtained.  Ultrasound was performed to localize and mark an adequate pocket of fluid in the right chest. The area was then prepped and draped in the normal sterile fashion. 1% Lidocaine was used for local anesthesia. Under ultrasound guidance a Yueh catheter was introduced. Thoracentesis was performed. The catheter was removed and a dressing applied. During the thoracentesis, the patient experienced weakness along with bradycardia. He appeared clammy and the overall appearance was compatible with a vagal reaction. We placed the patient in the left lateral decubitus position and placed him in Trendelenburg. An IV was placed along with oxygen 2 L by nasal cannula. Blood pressure and pulse stabilized to normal and the procedure was completed. A postprocedural chest radiograph demonstrated no pneumothorax. The patient was sent to day surgery/short-stay for monitoring. FINDINGS: A total of approximately 1500 cc of serosanguineous fluid was removed. Samples were sent to the laboratory as requested by the clinical team. IMPRESSION: Successful ultrasound  guided right thoracentesis yielding 1500 cc of pleural fluid. The patient did experience a vagal reaction during the procedure, treated conservatively as noted above and monitored afterwards in short-stay. Electronically Signed   By: Van Clines M.D.   On: 12/23/2019 12:35    ASSESSMENT: This is a very pleasant 79 years old white male recently diagnosed with metastatic poorly differentiated malignancy highly suspicious for poorly differentiated sarcomatoid carcinoma but malignant mesothelioma could not be completely excluded.  This was diagnosed in July 2021 and presented with malignant right pleural effusion as well as pleural thickening, questionable AP window lymph node and also a suspicious lesion in the midportion of the pancreas.   PLAN: I had a lengthy discussion with the patient and his family today about his current disease stage, prognosis and treatment  options. I personally and independently reviewed the the PET scan images and discussed the results and showed the images to the patient and his family. I also discussed with him the ambiguity of the final pathology.  He is likely has poorly differentiated sarcomatoid carcinoma but malignant pleural mesothelioma could not be excluded. I requested the [facility] for molecular studies and PD-L1 expression. I explained to the patient that he has incurable condition and all the treatment would be of palliative nature. I gave him the option of palliative care and hospice referral versus consideration of palliative systemic chemotherapy with a combination of 2 cycles of carboplatin for AUC of 5 and paclitaxel 175 mg/M2 in addition to immunotherapy with ipilimumab 1 mg/KG every 6 weeks and nivolumab 360 mg IV every 3 weeks.  The choice of this regimen was mainly because of the unclear pathology and possibility of malignant mesothelioma which could respond well to the immunotherapy with a combination of ipilimumab and nivolumab.  The 2 cycles of carboplatin and paclitaxel may also help if the pathology is consistent with the sarcomatoid carcinoma. I discussed with the patient the adverse effect of this treatment including but not limited to alopecia, myelosuppression, nausea and vomiting, peripheral neuropathy, liver or renal dysfunction as well as immunotherapy adverse effects. He is expected to start the first cycle of this treatment next week. I will refer the patient back to Dr. Servando Richards for consideration of Port-A-Cath placement. I will arrange for the patient to have a chemotherapy education class before the first dose of his treatment. I will send prescription for Compazine 10 mg p.o. every 6 hours as needed for nausea to his pharmacy in addition to EMLA cream to be applied to the Port-A-Cath site before treatment. The patient will come back for follow-up visit in 2 weeks  for evaluation and management of any adverse effect of his treatment. I will also arrange for the patient to have MRI of the brain to complete the staging work-up. He was advised to call immediately if he has any concerning symptoms in the interval. The patient voices understanding of current disease status and treatment options and is in agreement with the current care plan.  All questions were answered. The patient knows to call the clinic with any problems, questions or concerns. We can certainly see the patient much sooner if necessary.  Thank you so much for allowing me to participate in the care of Javell T Tafoya. I will continue to follow up the patient with you and assist in his care.  The total time spent in the appointment was 90 minutes.  Disclaimer: This note was dictated with voice recognition software.  Similar sounding words can inadvertently be transcribed and may not be corrected upon review.   Eilleen Kempf January 12, 2020, 2:50 PM

## 2020-01-12 NOTE — Patient Instructions (Signed)
Nivolumab injection What is this medicine? NIVOLUMAB (nye VOL ue mab) is a monoclonal antibody. It is used to treat colon cancer, esophageal cancer, head and neck cancer, Hodgkin lymphoma, kidney cancer, liver cancer, lung cancer, mesothelioma, melanoma, and urothelial cancer. This medicine may be used for other purposes; ask your health care provider or pharmacist if you have questions. COMMON BRAND NAME(S): Opdivo What should I tell my health care provider before I take this medicine? They need to know if you have any of these conditions:  diabetes  immune system problems  kidney disease  liver disease  lung disease  organ transplant  stomach or intestine problems  thyroid disease  an unusual or allergic reaction to nivolumab, other medicines, foods, dyes, or preservatives  pregnant or trying to get pregnant  breast-feeding How should I use this medicine? This medicine is for infusion into a vein. It is given by a health care professional in a hospital or clinic setting. A special MedGuide will be given to you before each treatment. Be sure to read this information carefully each time. Talk to your pediatrician regarding the use of this medicine in children. While this drug may be prescribed for children as young as 12 years for selected conditions, precautions do apply. Overdosage: If you think you have taken too much of this medicine contact a poison control center or emergency room at once. NOTE: This medicine is only for you. Do not share this medicine with others. What if I miss a dose? It is important not to miss your dose. Call your doctor or health care professional if you are unable to keep an appointment. What may interact with this medicine? Interactions have not been studied. Give your health care provider a list of all the medicines, herbs, non-prescription drugs, or dietary supplements you use. Also tell them if you smoke, drink alcohol, or use illegal drugs.  Some items may interact with your medicine. This list may not describe all possible interactions. Give your health care provider a list of all the medicines, herbs, non-prescription drugs, or dietary supplements you use. Also tell them if you smoke, drink alcohol, or use illegal drugs. Some items may interact with your medicine. What should I watch for while using this medicine? This drug may make you feel generally unwell. Continue your course of treatment even though you feel ill unless your doctor tells you to stop. You may need blood work done while you are taking this medicine. Do not become pregnant while taking this medicine or for 5 months after stopping it. Women should inform their doctor if they wish to become pregnant or think they might be pregnant. There is a potential for serious side effects to an unborn child. Talk to your health care professional or pharmacist for more information. Do not breast-feed an infant while taking this medicine or for 5 months after stopping it. What side effects may I notice from receiving this medicine? Side effects that you should report to your doctor or health care professional as soon as possible:  allergic reactions like skin rash, itching or hives, swelling of the face, lips, or tongue  breathing problems  blood in the urine  bloody or watery diarrhea or black, tarry stools  changes in emotions or moods  changes in vision  chest pain  cough  dizziness  feeling faint or lightheaded, falls  fever, chills  headache with fever, neck stiffness, confusion, loss of memory, sensitivity to light, hallucination, loss of contact with reality, or  seizures  joint pain  mouth sores  redness, blistering, peeling or loosening of the skin, including inside the mouth  severe muscle pain or weakness  signs and symptoms of high blood sugar such as dizziness; dry mouth; dry skin; fruity breath; nausea; stomach pain; increased hunger or thirst;  increased urination  signs and symptoms of kidney injury like trouble passing urine or change in the amount of urine  signs and symptoms of liver injury like dark yellow or brown urine; general ill feeling or flu-like symptoms; light-colored stools; loss of appetite; nausea; right upper belly pain; unusually weak or tired; yellowing of the eyes or skin  swelling of the ankles, feet, hands  trouble passing urine or change in the amount of urine  unusually weak or tired  weight gain or loss Side effects that usually do not require medical attention (report to your doctor or health care professional if they continue or are bothersome):  bone pain  constipation  decreased appetite  diarrhea  muscle pain  nausea, vomiting  tiredness This list may not describe all possible side effects. Call your doctor for medical advice about side effects. You may report side effects to FDA at 1-800-FDA-1088. Where should I keep my medicine? This drug is given in a hospital or clinic and will not be stored at home. NOTE: This sheet is a summary. It may not cover all possible information. If you have questions about this medicine, talk to your doctor, pharmacist, or health care provider.  2020 Elsevier/Gold Standard (2019-03-15 10:04:50) Ipilimumab injection What is this medicine? IPILIMUMAB (IP i LIM ue mab) is a monoclonal antibody. It is used to treat colorectal cancer, kidney cancer, liver cancer, lung cancer, melanoma, and mesothelioma. This medicine may be used for other purposes; ask your health care provider or pharmacist if you have questions. COMMON BRAND NAME(S): YERVOY What should I tell my health care provider before I take this medicine? They need to know if you have any of these conditions:  Addison's disease  blood in your stools (black or tarry stools) or if you have blood in your vomit  eye disease, vision problems  history of pancreatitis  history of stomach  bleeding  immune system problems  inflammatory bowel disease  kidney disease  liver disease  lupus  myasthenia gravis  organ transplant  rheumatoid arthritis  sarcoidosis  stomach or intestine problems  thyroid disease  tingling of the fingers or toes, or other nerve disorder  an unusual or allergic reaction to ipilimumab, other medicines, foods, dyes, or preservatives  pregnant or trying to get pregnant  breast-feeding How should I use this medicine? This medicine is for infusion into a vein. It is given by a health care professional in a hospital or clinic setting. A special MedGuide will be given to you before each treatment. Be sure to read this information carefully each time. Talk to your pediatrician regarding the use of this medicine in children. While this drug may be prescribed for children as young as 12 years for selected conditions, precautions do apply. Overdosage: If you think you have taken too much of this medicine contact a poison control center or emergency room at once. NOTE: This medicine is only for you. Do not share this medicine with others. What if I miss a dose? It is important not to miss your dose. Call your doctor or health care professional if you are unable to keep an appointment. What may interact with this medicine? Interactions are not expected.  This list may not describe all possible interactions. Give your health care provider a list of all the medicines, herbs, non-prescription drugs, or dietary supplements you use. Also tell them if you smoke, drink alcohol, or use illegal drugs. Some items may interact with your medicine. What should I watch for while using this medicine? Tell your doctor or healthcare professional if your symptoms do not start to get better or if they get worse. Do not become pregnant while taking this medicine or for 3 months after stopping it. Women should inform their doctor if they wish to become pregnant or  think they might be pregnant. There is a potential for serious side effects to an unborn child. Talk to your health care professional or pharmacist for more information. Do not breast-feed an infant while taking this medicine or for 3 months after the last dose. Your condition will be monitored carefully while you are receiving this medicine. You may need blood work done while you are taking this medicine. What side effects may I notice from receiving this medicine? Side effects that you should report to your doctor or health care professional as soon as possible:  allergic reactions like skin rash, itching or hives, swelling of the face, lips, or tongue  black, tarry stools  bloody or watery diarrhea  changes in vision  dizziness  eye pain  fast, irregular heartbeat  feeling anxious  feeling faint or lightheaded, falls  nausea, vomiting  pain, tingling, numbness in the hands or feet  redness, blistering, peeling or loosening of the skin, including inside the mouth  signs and symptoms of liver injury like dark yellow or brown urine; general ill feeling or flu-like symptoms; light-colored stools; loss of appetite; nausea; right upper belly pain; unusually weak or tired; yellowing of the eyes or skin  unusual bleeding or bruising Side effects that usually do not require medical attention (report to your doctor or health care professional if they continue or are bothersome):  headache  loss of appetite  trouble sleeping This list may not describe all possible side effects. Call your doctor for medical advice about side effects. You may report side effects to FDA at 1-800-FDA-1088. Where should I keep my medicine? This drug is given in a hospital or clinic and will not be stored at home. NOTE: This sheet is a summary. It may not cover all possible information. If you have questions about this medicine, talk to your doctor, pharmacist, or health care provider.  2020  Elsevier/Gold Standard (2019-03-15 10:56:55) Paclitaxel injection What is this medicine? PACLITAXEL (PAK li TAX el) is a chemotherapy drug. It targets fast dividing cells, like cancer cells, and causes these cells to die. This medicine is used to treat ovarian cancer, breast cancer, lung cancer, Kaposi's sarcoma, and other cancers. This medicine may be used for other purposes; ask your health care provider or pharmacist if you have questions. COMMON BRAND NAME(S): Onxol, Taxol What should I tell my health care provider before I take this medicine? They need to know if you have any of these conditions:  history of irregular heartbeat  liver disease  low blood counts, like low white cell, platelet, or red cell counts  lung or breathing disease, like asthma  tingling of the fingers or toes, or other nerve disorder  an unusual or allergic reaction to paclitaxel, alcohol, polyoxyethylated castor oil, other chemotherapy, other medicines, foods, dyes, or preservatives  pregnant or trying to get pregnant  breast-feeding How should I use this medicine?  This drug is given as an infusion into a vein. It is administered in a hospital or clinic by a specially trained health care professional. Talk to your pediatrician regarding the use of this medicine in children. Special care may be needed. Overdosage: If you think you have taken too much of this medicine contact a poison control center or emergency room at once. NOTE: This medicine is only for you. Do not share this medicine with others. What if I miss a dose? It is important not to miss your dose. Call your doctor or health care professional if you are unable to keep an appointment. What may interact with this medicine? Do not take this medicine with any of the following medications:  disulfiram  metronidazole This medicine may also interact with the following medications:  antiviral medicines for hepatitis, HIV or AIDS  certain  antibiotics like erythromycin and clarithromycin  certain medicines for fungal infections like ketoconazole and itraconazole  certain medicines for seizures like carbamazepine, phenobarbital, phenytoin  gemfibrozil  nefazodone  rifampin  St. John's wort This list may not describe all possible interactions. Give your health care provider a list of all the medicines, herbs, non-prescription drugs, or dietary supplements you use. Also tell them if you smoke, drink alcohol, or use illegal drugs. Some items may interact with your medicine. What should I watch for while using this medicine? Your condition will be monitored carefully while you are receiving this medicine. You will need important blood work done while you are taking this medicine. This medicine can cause serious allergic reactions. To reduce your risk you will need to take other medicine(s) before treatment with this medicine. If you experience allergic reactions like skin rash, itching or hives, swelling of the face, lips, or tongue, tell your doctor or health care professional right away. In some cases, you may be given additional medicines to help with side effects. Follow all directions for their use. This drug may make you feel generally unwell. This is not uncommon, as chemotherapy can affect healthy cells as well as cancer cells. Report any side effects. Continue your course of treatment even though you feel ill unless your doctor tells you to stop. Call your doctor or health care professional for advice if you get a fever, chills or sore throat, or other symptoms of a cold or flu. Do not treat yourself. This drug decreases your body's ability to fight infections. Try to avoid being around people who are sick. This medicine may increase your risk to bruise or bleed. Call your doctor or health care professional if you notice any unusual bleeding. Be careful brushing and flossing your teeth or using a toothpick because you may get  an infection or bleed more easily. If you have any dental work done, tell your dentist you are receiving this medicine. Avoid taking products that contain aspirin, acetaminophen, ibuprofen, naproxen, or ketoprofen unless instructed by your doctor. These medicines may hide a fever. Do not become pregnant while taking this medicine. Women should inform their doctor if they wish to become pregnant or think they might be pregnant. There is a potential for serious side effects to an unborn child. Talk to your health care professional or pharmacist for more information. Do not breast-feed an infant while taking this medicine. Men are advised not to father a child while receiving this medicine. This product may contain alcohol. Ask your pharmacist or healthcare provider if this medicine contains alcohol. Be sure to tell all healthcare providers you are  taking this medicine. Certain medicines, like metronidazole and disulfiram, can cause an unpleasant reaction when taken with alcohol. The reaction includes flushing, headache, nausea, vomiting, sweating, and increased thirst. The reaction can last from 30 minutes to several hours. What side effects may I notice from receiving this medicine? Side effects that you should report to your doctor or health care professional as soon as possible:  allergic reactions like skin rash, itching or hives, swelling of the face, lips, or tongue  breathing problems  changes in vision  fast, irregular heartbeat  high or low blood pressure  mouth sores  pain, tingling, numbness in the hands or feet  signs of decreased platelets or bleeding - bruising, pinpoint red spots on the skin, black, tarry stools, blood in the urine  signs of decreased red blood cells - unusually weak or tired, feeling faint or lightheaded, falls  signs of infection - fever or chills, cough, sore throat, pain or difficulty passing urine  signs and symptoms of liver injury like dark yellow or  brown urine; general ill feeling or flu-like symptoms; light-colored stools; loss of appetite; nausea; right upper belly pain; unusually weak or tired; yellowing of the eyes or skin  swelling of the ankles, feet, hands  unusually slow heartbeat Side effects that usually do not require medical attention (report to your doctor or health care professional if they continue or are bothersome):  diarrhea  hair loss  loss of appetite  muscle or joint pain  nausea, vomiting  pain, redness, or irritation at site where injected  tiredness This list may not describe all possible side effects. Call your doctor for medical advice about side effects. You may report side effects to FDA at 1-800-FDA-1088. Where should I keep my medicine? This drug is given in a hospital or clinic and will not be stored at home. NOTE: This sheet is a summary. It may not cover all possible information. If you have questions about this medicine, talk to your doctor, pharmacist, or health care provider.  2020 Elsevier/Gold Standard (2017-01-27 13:14:55) Carboplatin injection What is this medicine? CARBOPLATIN (KAR boe pla tin) is a chemotherapy drug. It targets fast dividing cells, like cancer cells, and causes these cells to die. This medicine is used to treat ovarian cancer and many other cancers. This medicine may be used for other purposes; ask your health care provider or pharmacist if you have questions. COMMON BRAND NAME(S): Paraplatin What should I tell my health care provider before I take this medicine? They need to know if you have any of these conditions:  blood disorders  hearing problems  kidney disease  recent or ongoing radiation therapy  an unusual or allergic reaction to carboplatin, cisplatin, other chemotherapy, other medicines, foods, dyes, or preservatives  pregnant or trying to get pregnant  breast-feeding How should I use this medicine? This drug is usually given as an infusion  into a vein. It is administered in a hospital or clinic by a specially trained health care professional. Talk to your pediatrician regarding the use of this medicine in children. Special care may be needed. Overdosage: If you think you have taken too much of this medicine contact a poison control center or emergency room at once. NOTE: This medicine is only for you. Do not share this medicine with others. What if I miss a dose? It is important not to miss a dose. Call your doctor or health care professional if you are unable to keep an appointment. What may interact with  this medicine?  medicines for seizures  medicines to increase blood counts like filgrastim, pegfilgrastim, sargramostim  some antibiotics like amikacin, gentamicin, neomycin, streptomycin, tobramycin  vaccines Talk to your doctor or health care professional before taking any of these medicines:  acetaminophen  aspirin  ibuprofen  ketoprofen  naproxen This list may not describe all possible interactions. Give your health care provider a list of all the medicines, herbs, non-prescription drugs, or dietary supplements you use. Also tell them if you smoke, drink alcohol, or use illegal drugs. Some items may interact with your medicine. What should I watch for while using this medicine? Your condition will be monitored carefully while you are receiving this medicine. You will need important blood work done while you are taking this medicine. This drug may make you feel generally unwell. This is not uncommon, as chemotherapy can affect healthy cells as well as cancer cells. Report any side effects. Continue your course of treatment even though you feel ill unless your doctor tells you to stop. In some cases, you may be given additional medicines to help with side effects. Follow all directions for their use. Call your doctor or health care professional for advice if you get a fever, chills or sore throat, or other symptoms  of a cold or flu. Do not treat yourself. This drug decreases your body's ability to fight infections. Try to avoid being around people who are sick. This medicine may increase your risk to bruise or bleed. Call your doctor or health care professional if you notice any unusual bleeding. Be careful brushing and flossing your teeth or using a toothpick because you may get an infection or bleed more easily. If you have any dental work done, tell your dentist you are receiving this medicine. Avoid taking products that contain aspirin, acetaminophen, ibuprofen, naproxen, or ketoprofen unless instructed by your doctor. These medicines may hide a fever. Do not become pregnant while taking this medicine. Women should inform their doctor if they wish to become pregnant or think they might be pregnant. There is a potential for serious side effects to an unborn child. Talk to your health care professional or pharmacist for more information. Do not breast-feed an infant while taking this medicine. What side effects may I notice from receiving this medicine? Side effects that you should report to your doctor or health care professional as soon as possible:  allergic reactions like skin rash, itching or hives, swelling of the face, lips, or tongue  signs of infection - fever or chills, cough, sore throat, pain or difficulty passing urine  signs of decreased platelets or bleeding - bruising, pinpoint red spots on the skin, black, tarry stools, nosebleeds  signs of decreased red blood cells - unusually weak or tired, fainting spells, lightheadedness  breathing problems  changes in hearing  changes in vision  chest pain  high blood pressure  low blood counts - This drug may decrease the number of white blood cells, red blood cells and platelets. You may be at increased risk for infections and bleeding.  nausea and vomiting  pain, swelling, redness or irritation at the injection site  pain, tingling,  numbness in the hands or feet  problems with balance, talking, walking  trouble passing urine or change in the amount of urine Side effects that usually do not require medical attention (report to your doctor or health care professional if they continue or are bothersome):  hair loss  loss of appetite  metallic taste in the mouth  or changes in taste This list may not describe all possible side effects. Call your doctor for medical advice about side effects. You may report side effects to FDA at 1-800-FDA-1088. Where should I keep my medicine? This drug is given in a hospital or clinic and will not be stored at home. NOTE: This sheet is a summary. It may not cover all possible information. If you have questions about this medicine, talk to your doctor, pharmacist, or health care provider.  2020 Elsevier/Gold Standard (2007-08-31 14:38:05) Lung Cancer Lung cancer is an abnormal growth of cancerous cells that forms a mass (malignant tumor) in a lung. There are several types of lung cancer. The types are based on the appearance of the tumor cells. The two most common types are:  Non-small cell lung cancer. This type of lung cancer is the most common type. Non-small cell lung cancers include squamous cell carcinoma, adenocarcinoma, and large cell carcinoma.  Small cell lung cancer. In this type of lung cancer, abnormal cells are smaller than those of non-small cell lung cancer. Small cell lung cancer gets worse (progresses) faster than non-small cell lung cancer. What are the causes? The most common cause of lung cancer is smoking tobacco. The second most common cause is exposure to a chemical called radon. What increases the risk? You are more likely to develop this condition if:  You smoke tobacco.  You have been exposed to: ? Secondhand tobacco smoke. ? Radon gas. ? Uranium. ? Asbestos. ? Arsenic in drinking water. ? Air pollution.  You have a family or personal history of lung  cancer.  You have had lung radiation therapy in the past.  You are older than age 67. What are the signs or symptoms? In the early stages, you may not have any symptoms. As the cancer progresses, symptoms may include:  A lasting cough, possibly with blood.  Fatigue.  Unexplained weight loss.  Shortness of breath.  Loud breathing (wheezing).  Chest pain.  Loss of appetite. Symptoms of advanced lung cancer include:  Hoarseness.  Bone or joint pain.  Weakness.  Change in the structure of the fingernails (clubbing), so that the nail looks like an upside-down spoon.  Swelling of the face or arms.  Inability to move the face (paralysis).  Drooping eyelids. How is this diagnosed? This condition may be diagnosed based on:  Your symptoms and medical history.  A physical exam.  A chest X-ray.  A CT scan.  Blood tests.  Sputum tests.  Removal of a sample of lung tissue (lung biopsy) for testing. Your cancer will be assessed (staged) to determine how severe it is and how much it has spread (metastasized). How is this treated? Treatment depends on the type and stage of your cancer. Treatment may include one or more of the following:  Surgery to remove as much of the cancer as possible. Lymph nodes in the area may be removed and tested for cancer as well.  Medicines that kill cancer cells (chemotherapy).  High-energy rays that kill cancer cells (radiation therapy).  Chemotherapy. This treatment uses medicines to destroy cancer cells.  Targeted therapy. This targets specific parts of cancer cells and the area around them to block the growth and spread of the cancer. Targeted therapy can help limit the damage to healthy cells. Follow these instructions at home: Eating and drinking  Some of your treatments might affect your appetite. If you are having problems eating, or if you do not have an appetite, meet with  a dietitian.  If you have side effects that affect  your appetite, it may help to: ? Eat smaller meals and snacks often. ? Drink high-nutrition and high-calorie shakes or supplements. ? Eat bland and soft foods that are easy to eat. ? Avoid eating foods that are hot, spicy, or hard to swallow. General instructions   Do not use any products that contain nicotine or tobacco, such as cigarettes and e-cigarettes. If you need help quitting, ask your health care provider.  Do not drink alcohol.  If you are admitted to the hospital, make sure your cancer specialist (oncologist) is aware. Your cancer may affect your treatment for other conditions.  Take over-the-counter and prescription medicines only as told by your health care provider.  Consider joining a support group for people who have been diagnosed with lung cancer.  Work with your health care provider to manage any side effects of treatment.  Keep all follow-up visits as told by your health care provider. This is important. Where to find more information  American Cancer Society: https://www.cancer.[place] https://www.cancer.gov Contact a health care provider if you:  Lose weight without trying.  Have a persistent cough and wheezing.  Feel short of breath.  Get tired easily.  Have bone or joint pain.  Have difficulty swallowing.  Notice that your voice is changing or getting hoarse.  Have pain that does not get better with medicine. Get help right away if you:  Cough up blood.  Have new breathing problems.  Have chest pain.  Have a fever.  Have swelling in an ankle, leg, or arm, or the face or neck.  Have paralysis in your face.  Are very confused.  Have a drooping eyelid. Summary  Lung cancer is an abnormal growth of cancerous cells that forms a mass (malignant tumor) in a lung.  There are several types of lung cancer. The types are based on the appearance of the tumor cells. The two most common types are non-small cell  and small cell.  The most common cause of lung cancer is smoking tobacco.  Early symptoms include a lasting cough, possibly with blood, and fatigue, unexplained weight loss, and shortness of breath.  After diagnosis, treatment depends on the type and stage of your cancer. This information is not intended to replace advice given to you by your health care provider. Make sure you discuss any questions you have with your health care provider. Document Revised: 05/08/2017 Document Reviewed: 04/02/2017 Elsevier Patient Education  2020 [employer].

## 2020-01-13 ENCOUNTER — Other Ambulatory Visit: Payer: Self-pay | Admitting: *Deleted

## 2020-01-13 ENCOUNTER — Telehealth: Payer: Self-pay | Admitting: Internal Medicine

## 2020-01-13 ENCOUNTER — Encounter (HOSPITAL_COMMUNITY): Payer: Self-pay | Admitting: Cardiothoracic Surgery

## 2020-01-13 DIAGNOSIS — M1712 Unilateral primary osteoarthritis, left knee: Secondary | ICD-10-CM | POA: Diagnosis not present

## 2020-01-13 DIAGNOSIS — C3490 Malignant neoplasm of unspecified part of unspecified bronchus or lung: Secondary | ICD-10-CM

## 2020-01-13 DIAGNOSIS — M25462 Effusion, left knee: Secondary | ICD-10-CM | POA: Diagnosis not present

## 2020-01-13 NOTE — Telephone Encounter (Addendum)
Scheduled per los. Called and spoke with patients wife. Confirmed scheduled appts. Informed 8/12 will be added once approved .

## 2020-01-15 DIAGNOSIS — K2270 Barrett's esophagus without dysplasia: Secondary | ICD-10-CM | POA: Diagnosis not present

## 2020-01-15 DIAGNOSIS — M4802 Spinal stenosis, cervical region: Secondary | ICD-10-CM | POA: Diagnosis not present

## 2020-01-15 DIAGNOSIS — J910 Malignant pleural effusion: Secondary | ICD-10-CM | POA: Diagnosis not present

## 2020-01-15 DIAGNOSIS — K219 Gastro-esophageal reflux disease without esophagitis: Secondary | ICD-10-CM | POA: Diagnosis not present

## 2020-01-15 DIAGNOSIS — I10 Essential (primary) hypertension: Secondary | ICD-10-CM | POA: Diagnosis not present

## 2020-01-15 DIAGNOSIS — Z438 Encounter for attention to other artificial openings: Secondary | ICD-10-CM | POA: Diagnosis not present

## 2020-01-15 DIAGNOSIS — J986 Disorders of diaphragm: Secondary | ICD-10-CM | POA: Diagnosis not present

## 2020-01-15 DIAGNOSIS — Z483 Aftercare following surgery for neoplasm: Secondary | ICD-10-CM | POA: Diagnosis not present

## 2020-01-15 DIAGNOSIS — C384 Malignant neoplasm of pleura: Secondary | ICD-10-CM | POA: Diagnosis not present

## 2020-01-15 DIAGNOSIS — G588 Other specified mononeuropathies: Secondary | ICD-10-CM | POA: Diagnosis not present

## 2020-01-16 ENCOUNTER — Ambulatory Visit (INDEPENDENT_AMBULATORY_CARE_PROVIDER_SITE_OTHER): Payer: Self-pay | Admitting: Cardiothoracic Surgery

## 2020-01-16 ENCOUNTER — Ambulatory Visit
Admission: RE | Admit: 2020-01-16 | Discharge: 2020-01-16 | Disposition: A | Discharge status: Home / Self Care | Payer: Medicare Other | Source: Ambulatory Visit | Attending: Cardiothoracic Surgery | Admitting: Cardiothoracic Surgery

## 2020-01-16 ENCOUNTER — Other Ambulatory Visit: Payer: Medicare Other

## 2020-01-16 ENCOUNTER — Telehealth: Payer: Self-pay | Admitting: *Deleted

## 2020-01-16 ENCOUNTER — Other Ambulatory Visit: Payer: Self-pay | Admitting: *Deleted

## 2020-01-16 ENCOUNTER — Other Ambulatory Visit: Payer: Self-pay

## 2020-01-16 ENCOUNTER — Other Ambulatory Visit (HOSPITAL_COMMUNITY): Payer: Medicare Other

## 2020-01-16 VITALS — BP 121/78 | HR 109 | Temp 97.7°F | Resp 20 | Ht 72.0 in | Wt 203.0 lb

## 2020-01-16 DIAGNOSIS — Z515 Encounter for palliative care: Secondary | ICD-10-CM

## 2020-01-16 DIAGNOSIS — Z4682 Encounter for fitting and adjustment of non-vascular catheter: Secondary | ICD-10-CM | POA: Diagnosis not present

## 2020-01-16 DIAGNOSIS — J90 Pleural effusion, not elsewhere classified: Secondary | ICD-10-CM

## 2020-01-16 DIAGNOSIS — Z9689 Presence of other specified functional implants: Secondary | ICD-10-CM

## 2020-01-16 MED ORDER — HYDROCODONE-ACETAMINOPHEN 5-325 MG PO TABS: 1.0000 | ORAL_TABLET | Freq: Four times a day (QID) | ORAL | 0 refills | Status: AC | PRN

## 2020-01-16 NOTE — Progress Notes (Signed)
[address]       [place] [address]             [facility] Medical Record #[id number] Date of Birth: Jul 23, 1940  Referring: Tanda Rockers, MD Primary Care: Denny Levy, [place] Primary Cardiologist: Rozann Lesches, MD   Chief Complaint:   POST OP FOLLOW UP * THIS IS AN ADDENDUM REPORT  CASE: [id number]  PATIENT: [facility]  Surgical Pathology Report  Addendum   Reason for Addendum #1: Immunohistochemistry results   Clinical History: Right pleural effusion (jmc)    FINAL MICROSCOPIC DIAGNOSIS:   A. PLEURA, RIGHT, BIOPSY:  - Poorly differentiated malignancy  - See comment   COMMENT:   The neoplastic cells are positive for cytokeratin AE1/3 and D2-40 but  negative for cytokeratin 7, cytokeratin 5/6, p40, WT 1, calretinin,  moc-31 and TTF-1. 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 However, mesothelioma is not  entirely excluded.   Dr. Saralyn Pilar reviewed the case and agrees with the above diagnosis.  History of Present Illness:     Patient comes to the office today for follow-up after placement of right Pleurx, with pleural biopsies-and talc pleurodesis.  Dr. Earlie Server had contacted [place] late last week about placement port for chemotherapy.  This was arranged for tomorrow however the patient notified us today proceeding with chemotherapy.  His wife notes that hospice care is to be started he had come to the office today chest x-ray also to remove the Pleurx catheter which has not been draining much.      Past Medical History:  Diagnosis Date  . Ambulates with cane    "just since 11/2019"  . Arthritis   . Bronchitis   . COPD (chronic obstructive pulmonary disease) ([place])   . Dyspnea   . Dysrhythmia 11/2019   pt was told "a-fib and was normal for his age", recheck in 1 yr  .  Essential hypertension   . GERD (gastroesophageal reflux disease)   . Hearing loss    lett ear - no hearing aids  . History of pneumonia 2015  . Panic attacks   . Prostate cancer ([place])   . Urinary frequency   . Wears glasses      Social History   Tobacco Use  Smoking Status Former Smoker  . Packs/day: 1.00  . Years: 40.00  . Pack years: 40.00  . Quit date: 06/09/1997  . Years since quitting: 22.6  Smokeless Tobacco Never Used    Social History   Substance and Sexual Activity  Alcohol Use No  . Alcohol/week: 0.0 standard drinks     Allergies  Allergen Reactions  . Codeine     Itch, and bad headache  . Morphine And Related     Itch, and bad headache     Current Outpatient Medications  Medication Sig Dispense Refill  . acetaminophen (TYLENOL) 500 MG tablet Take 1,000 mg by mouth every 6 (six) hours as needed for moderate pain.    Marland Kitchen albuterol (PROVENTIL HFA;VENTOLIN HFA) 108 (90 BASE) MCG/ACT inhaler Inhale 2 puffs into the lungs every 6 (six) hours as needed for wheezing or shortness of breath.     Marland Kitchen albuterol (PROVENTIL) (2.5 MG/3ML) 0.083% nebulizer solution Inhale 3 mLs into the lungs in the morning and at bedtime.     . ALPRAZolam (XANAX) 0.25 MG  tablet Take 0.5 tablets by mouth at bedtime.    Marland Kitchen amLODipine (NORVASC) 5 MG tablet Take 5 mg by mouth daily.    . cyanocobalamin 1000 MCG tablet Take 1,000 mcg by mouth daily. Takes 2 daily    . HYDROcodone-acetaminophen (NORCO/VICODIN) 5-325 MG tablet Take 1 tablet by mouth every 6 (six) hours as needed for moderate pain. 20 tablet 0  . ibuprofen (ADVIL) 400 MG tablet Take 400 mg by mouth every 6 (six) hours as needed.     . Misc Natural Products (OSTEO BI-FLEX ADV TRIPLE ST PO) Take 2 capsules by mouth daily.    Marland Kitchen omeprazole (PRILOSEC) 20 MG capsule Take 20 mg by mouth daily.    Marland Kitchen PARoxetine (PAXIL) 10 MG tablet Take 10 mg by mouth daily. Started on 12/22/19    . lidocaine-prilocaine (EMLA) cream Apply to the Port-A-Cath  site 30-60-minute before treatment. (Patient not taking: Reported on 01/16/2020) 30 g 0  . prochlorperazine (COMPAZINE) 10 MG tablet Take 1 tablet (10 mg total) by mouth every 6 (six) hours as needed for nausea or vomiting. 30 tablet 0   No current facility-administered medications for this visit.       Physical Exam: BP 121/78   Pulse (!) 109   Temp 97.7 F (36.5 C) (Skin)   Resp 20   Ht 6' (1.829 m)   Wt 203 lb (92.1 kg)   SpO2 91% Comment: RA  BMI 27.53 kg/m   General appearance: alert, cooperative, fatigued and no distress Neurologic: intact Heart: regular rate and rhythm, S1, S2 normal, no murmur, click, rub or gallop Lungs: diminished breath sounds bibasilar Abdomen: normal findings: bowel sounds normal Extremities: extremities normal, atraumatic, no cyanosis or edema Wound: In the office today the Pleurx catheter was draining very little returned, the chest tube sutures were removed.  The site of the right Pleurx was prepped with Betadine 5 mL of 1% lidocaine was infiltrated around the Pleurx and the Pleurx was removed in its entirety, and occlusive dressing was placed and the patient instructed to keep it covered for 2 days.    Diagnostic Studies & Laboratory data:     Recent Radiology Findings:   DG Chest 2 View  Result Date: 01/16/2020 CLINICAL DATA:  Recurrent right pleural effusion. History of talc pleurodesis and pleural drainage catheter placement 12/30/2019. EXAM: CHEST - 2 VIEW COMPARISON:  Single-view of the chest 01/02/2020. FINDINGS: Right pleural drainage catheter is unchanged. There is a very small right pleural effusion. No pneumothorax. Volume loss in the right chest noted. Left lung is clear. Heart size is normal. No acute bony abnormality. IMPRESSION: No acute disease. No change in a right pleural drainage catheter in a very small right pleural effusion. Electronically Signed   By: Inge Rise M.D.   On: 01/16/2020 13:44      Recent Lab Findings: Lab  Results  Component Value Date   WBC 8.1 01/12/2020   HGB 13.9 01/12/2020   HCT 42.5 01/12/2020   PLT 477 (H) 01/12/2020   GLUCOSE 109 (H) 01/12/2020   ALT 15 01/12/2020   AST 17 01/12/2020   NA 137 01/12/2020   K 3.8 01/12/2020   CL 104 01/12/2020   CREATININE 0.77 01/12/2020   BUN 15 01/12/2020   CO2 25 01/12/2020   INR 1.1 12/28/2019      Assessment / Plan:   #1 Pleurx catheter now ready for removal-taken out in the office without difficulty today #2 underlying pleural-based malignancy primary pathologic suggestion is  that its poorly differentiated sarcomatous-but pathology cannot rule out mesothelioma #3 if cancer placement for this patient and his wife have conferred decided to go with palliative care only   He was given Vicodin tablets for pain control after removal of the Pleurx and also until hospice can establish care with him.  We made a return appointment to be seen in 2 weeks and follow-up as needed.  Medication Changes: Meds ordered this encounter  Medications  . HYDROcodone-acetaminophen (NORCO/VICODIN) 5-325 MG tablet    Sig: Take 1 tablet by mouth every 6 (six) hours as needed for moderate pain.    Dispense:  20 tablet    Refill:  0      Grace Isaac MD      [address] [place] [address] Office [phone]     01/16/2020 2:44 PM

## 2020-01-16 NOTE — Telephone Encounter (Signed)
Pt does not want treatment and wants Hospice.   Attending should be PCP. Hospice notified.

## 2020-01-17 ENCOUNTER — Ambulatory Visit: Payer: Medicare Other | Admitting: Cardiothoracic Surgery

## 2020-01-17 ENCOUNTER — Encounter (HOSPITAL_COMMUNITY): Admission: RE | Payer: Self-pay | Source: Home / Self Care

## 2020-01-17 ENCOUNTER — Ambulatory Visit (HOSPITAL_COMMUNITY): Admission: RE | Admit: 2020-01-17 | Payer: Medicare Other | Source: Home / Self Care | Admitting: Cardiothoracic Surgery

## 2020-01-17 DIAGNOSIS — C3490 Malignant neoplasm of unspecified part of unspecified bronchus or lung: Secondary | ICD-10-CM | POA: Diagnosis not present

## 2020-01-17 SURGERY — INSERTION, TUNNELED CENTRAL VENOUS DEVICE, WITH PORT
Anesthesia: Monitor Anesthesia Care | Laterality: Right

## 2020-01-18 ENCOUNTER — Ambulatory Visit (HOSPITAL_COMMUNITY): Payer: Medicare Other

## 2020-01-20 ENCOUNTER — Telehealth: Payer: Self-pay | Admitting: Internal Medicine

## 2020-01-20 ENCOUNTER — Encounter (HOSPITAL_COMMUNITY): Payer: Self-pay | Admitting: Cardiothoracic Surgery

## 2020-01-20 NOTE — Telephone Encounter (Signed)
Patient's wife calling for lab results, walked her through MyChart to find labs. Wife on DPR.

## 2020-01-21 ENCOUNTER — Ambulatory Visit: Payer: Medicare Other

## 2020-01-25 ENCOUNTER — Other Ambulatory Visit: Payer: Self-pay | Admitting: Cardiothoracic Surgery

## 2020-01-25 DIAGNOSIS — J90 Pleural effusion, not elsewhere classified: Secondary | ICD-10-CM

## 2020-01-26 ENCOUNTER — Other Ambulatory Visit: Payer: Medicare Other

## 2020-01-26 ENCOUNTER — Other Ambulatory Visit: Payer: Self-pay

## 2020-01-26 ENCOUNTER — Ambulatory Visit (INDEPENDENT_AMBULATORY_CARE_PROVIDER_SITE_OTHER): Payer: Self-pay | Admitting: Cardiothoracic Surgery

## 2020-01-26 ENCOUNTER — Ambulatory Visit
Admission: RE | Admit: 2020-01-26 | Discharge: 2020-01-26 | Disposition: A | Discharge status: Home / Self Care | Payer: Medicare Other | Source: Ambulatory Visit | Attending: Cardiothoracic Surgery | Admitting: Cardiothoracic Surgery

## 2020-01-26 ENCOUNTER — Ambulatory Visit: Payer: Medicare Other | Admitting: Physician Assistant

## 2020-01-26 VITALS — BP 134/86 | HR 107 | Temp 96.1°F | Resp 24 | Ht 72.0 in | Wt 203.0 lb

## 2020-01-26 DIAGNOSIS — J929 Pleural plaque without asbestos: Secondary | ICD-10-CM | POA: Diagnosis not present

## 2020-01-26 DIAGNOSIS — J90 Pleural effusion, not elsewhere classified: Secondary | ICD-10-CM

## 2020-01-26 DIAGNOSIS — J9811 Atelectasis: Secondary | ICD-10-CM | POA: Diagnosis not present

## 2020-01-26 DIAGNOSIS — J910 Malignant pleural effusion: Secondary | ICD-10-CM | POA: Diagnosis not present

## 2020-01-26 DIAGNOSIS — J449 Chronic obstructive pulmonary disease, unspecified: Secondary | ICD-10-CM | POA: Diagnosis not present

## 2020-01-26 DIAGNOSIS — C499 Malignant neoplasm of connective and soft tissue, unspecified: Secondary | ICD-10-CM | POA: Diagnosis not present

## 2020-01-26 DIAGNOSIS — Z7189 Other specified counseling: Secondary | ICD-10-CM | POA: Diagnosis not present

## 2020-01-26 DIAGNOSIS — C3490 Malignant neoplasm of unspecified part of unspecified bronchus or lung: Secondary | ICD-10-CM

## 2020-01-26 DIAGNOSIS — Z0001 Encounter for general adult medical examination with abnormal findings: Secondary | ICD-10-CM | POA: Diagnosis not present

## 2020-01-26 DIAGNOSIS — I517 Cardiomegaly: Secondary | ICD-10-CM | POA: Diagnosis not present

## 2020-01-26 DIAGNOSIS — R918 Other nonspecific abnormal finding of lung field: Secondary | ICD-10-CM | POA: Diagnosis not present

## 2020-01-26 NOTE — Progress Notes (Signed)
[address]       [place] [address]             [facility] Medical Record #[id number] Date of Birth: 01-06-1941  Referring: Tanda Rockers, MD Primary Care: Denny Levy, [place] Primary Cardiologist: Rozann Lesches, MD   Chief Complaint:   POST OP FOLLOW UP * THIS IS AN ADDENDUM REPORT  CASE: [id number]  PATIENT: [facility]  Surgical Pathology Report  Addendum   Reason for Addendum #1: Immunohistochemistry results   Clinical History: Right pleural effusion (jmc)    FINAL MICROSCOPIC DIAGNOSIS:   A. PLEURA, RIGHT, BIOPSY:  - Poorly differentiated malignancy  - See comment   COMMENT:   The neoplastic cells are positive for cytokeratin AE1/3 and D2-40 but  negative for cytokeratin 7, cytokeratin 5/6, p40, WT 1, calretinin,  moc-31 and TTF-1. 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 However, mesothelioma is not  entirely excluded.  Dr. Saralyn Pilar reviewed the case and agrees with the above diagnosis.    History of Present Illness:     Patient comes to the office today for follow-up after placement of right Pleurx, with pleural biopsies-and talc pleurodesis.  Patient and his wife report that he had seen Dr. Earlie Server discussed chemotherapy treatment options, they have opted for hospice care. He is seeing his primary care doctor today to arrange.  Patient comes in in a wheelchair, remains weak, poor appetite. Wife said he has not needed much pain medicine.  Patient's right Pleurx catheter was removed 2 weeks ago.   Past Medical History:  Diagnosis Date  . Ambulates with cane    "just since 11/2019"  . Arthritis   . Bronchitis   . COPD (chronic obstructive pulmonary disease) ([place])   . Dyspnea   . Dysrhythmia 11/2019   pt was told "a-fib and was normal for his age", recheck in 1  yr  . Essential hypertension   . GERD (gastroesophageal reflux disease)   . Hearing loss    lett ear - no hearing aids  . History of pneumonia 2015  . Panic attacks   . Prostate cancer ([place])   . Urinary frequency   . Wears glasses      Social History   Tobacco Use  Smoking Status Former Smoker  . Packs/day: 1.00  . Years: 40.00  . Pack years: 40.00  . Quit date: 06/09/1997  . Years since quitting: 22.6  Smokeless Tobacco Never Used    Social History   Substance and Sexual Activity  Alcohol Use No  . Alcohol/week: 0.0 standard drinks     Allergies  Allergen Reactions  . Codeine     Itch, and bad headache  . Morphine And Related     Itch, and bad headache     Current Outpatient Medications  Medication Sig Dispense Refill  . acetaminophen (TYLENOL) 500 MG tablet Take 1,000 mg by mouth every 6 (six) hours as needed for moderate pain.    Marland Kitchen albuterol (PROVENTIL HFA;VENTOLIN HFA) 108 (90 BASE) MCG/ACT inhaler Inhale 2 puffs into the lungs every 6 (six) hours as needed for wheezing or shortness of breath.     Marland Kitchen albuterol (PROVENTIL) (2.5 MG/3ML) 0.083% nebulizer solution Inhale 3 mLs into the lungs in the morning and at bedtime.     . ALPRAZolam (XANAX) 0.25 MG tablet  Take 0.5 tablets by mouth at bedtime.    Marland Kitchen amLODipine (NORVASC) 5 MG tablet Take 5 mg by mouth daily.    . cyanocobalamin 1000 MCG tablet Take 1,000 mcg by mouth daily. Takes 2 daily    . HYDROcodone-acetaminophen (NORCO/VICODIN) 5-325 MG tablet Take 1 tablet by mouth every 6 (six) hours as needed for moderate pain. 20 tablet 0  . ibuprofen (ADVIL) 400 MG tablet Take 400 mg by mouth every 6 (six) hours as needed.     . Misc Natural Products (OSTEO BI-FLEX ADV TRIPLE ST PO) Take 2 capsules by mouth daily.    . nabumetone (RELAFEN) 500 MG tablet Take 500 mg by mouth daily.    Marland Kitchen omeprazole (PRILOSEC) 20 MG capsule Take 20 mg by mouth daily.    Marland Kitchen PARoxetine (PAXIL) 10 MG tablet Take 10 mg by mouth daily. Started  on 12/22/19     No current facility-administered medications for this visit.       Physical Exam: BP 134/86 (BP Location: Right Arm, Patient Position: Sitting, Cuff Size: Normal)   Pulse (!) 107   Temp (!) 96.1 F (35.6 C) (Temporal)   Resp (!) 24   Ht 6' (1.829 m)   Wt 203 lb (92.1 kg)   SpO2 93% Comment: RA  BMI 27.53 kg/m   General appearance: alert, cooperative, cachectic and fatigued Resp: clear to auscultation bilaterally Cardio: regular rate and rhythm, S1, S2 normal, no murmur, click, rub or gallop Extremities: extremities normal, atraumatic, no cyanosis or edema Neurologic: Grossly normal   Diagnostic Studies & Laboratory data:     Recent Radiology Findings:   DG Chest 2 View  Result Date: 01/26/2020 CLINICAL DATA:  Right pleural effusion. EXAM: CHEST - 2 VIEW COMPARISON:  01/16/2020.  PET-CT 01/11/2020. FINDINGS: Interim removal of right chest tube. No pneumothorax. Stable cardiomegaly. No pulmonary venous congestion. Persistent right base atelectasis persistent right-sided pleural thickening. IMPRESSION: 1.  Interim removal of right chest tube.  No pneumothorax. 2. Persistent right base atelectasis and right-sided pleural thickening. 3.  Stable cardiomegaly. Electronically Signed   By: Marcello Moores  Register   On: 01/26/2020 09:09    I have independently reviewed the above radiology studies  and reviewed the findings with the patient.    Recent Lab Findings: Lab Results  Component Value Date   WBC 8.1 01/12/2020   HGB 13.9 01/12/2020   HCT 42.5 01/12/2020   PLT 477 (H) 01/12/2020   GLUCOSE 109 (H) 01/12/2020   ALT 15 01/12/2020   AST 17 01/12/2020   NA 137 01/12/2020   K 3.8 01/12/2020   CL 104 01/12/2020   CREATININE 0.77 01/12/2020   BUN 15 01/12/2020   CO2 25 01/12/2020   INR 1.1 12/28/2019      Assessment / Plan:     1/ underlying pleural-based malignancy primary pathologic suggestion is that its poorly differentiated sarcomatous-but pathology  cannot rule out mesothelioma 2/malignant right pleural effusion treated with drainage pleural biopsies talc pleurodesis and placement of temporary Pleurx catheter which is now been removed  Patient is to see Dr. Florene Route later today and arrange local hospice care.  I discussed the case with Dr. Melina Copa pathology and per the patient's wife request a GATA 3 stain will be done on the pathologic material.  Dr. Melina Copa noted also that she would add 1 additional stain to evaluate for mesothelioma. An addendum will be issued  Plan see the patient back as necessary.  Medication Changes: No orders of the defined  types were placed in this encounter.     Grace Isaac MD      [address] [place] [address] Office [phone]     01/26/2020 9:48 AM

## 2020-01-27 DIAGNOSIS — M1712 Unilateral primary osteoarthritis, left knee: Secondary | ICD-10-CM | POA: Diagnosis not present

## 2020-02-02 ENCOUNTER — Other Ambulatory Visit: Payer: Medicare Other

## 2020-02-03 DIAGNOSIS — M1712 Unilateral primary osteoarthritis, left knee: Secondary | ICD-10-CM | POA: Diagnosis not present

## 2020-02-08 ENCOUNTER — Ambulatory Visit: Payer: Medicare Other | Admitting: Physician Assistant

## 2020-02-08 ENCOUNTER — Other Ambulatory Visit: Payer: Medicare Other

## 2020-02-08 ENCOUNTER — Ambulatory Visit: Payer: Medicare Other

## 2020-02-10 ENCOUNTER — Ambulatory Visit: Payer: Medicare Other

## 2020-02-10 DIAGNOSIS — M1712 Unilateral primary osteoarthritis, left knee: Secondary | ICD-10-CM | POA: Diagnosis not present

## 2020-02-16 ENCOUNTER — Other Ambulatory Visit: Payer: Medicare Other

## 2020-02-16 LAB — SURGICAL PATHOLOGY

## 2020-02-23 ENCOUNTER — Other Ambulatory Visit: Payer: Medicare Other

## 2020-03-01 ENCOUNTER — Ambulatory Visit: Payer: Medicare Other | Admitting: Internal Medicine

## 2020-03-01 ENCOUNTER — Ambulatory Visit: Payer: Medicare Other

## 2020-03-01 ENCOUNTER — Other Ambulatory Visit: Payer: Medicare Other

## 2020-03-03 ENCOUNTER — Ambulatory Visit: Payer: Medicare Other

## 2020-06-15 DIAGNOSIS — R5381 Other malaise: Secondary | ICD-10-CM | POA: Diagnosis not present

## 2020-06-15 DIAGNOSIS — W19XXXA Unspecified fall, initial encounter: Secondary | ICD-10-CM | POA: Diagnosis not present

## 2020-06-15 DIAGNOSIS — R531 Weakness: Secondary | ICD-10-CM | POA: Diagnosis not present

## 2020-07-10 DEATH — deceased

## 2021-09-13 IMAGING — DX DG CHEST 1V
1 series · 1 of 1 positions shown · non-contrast
Comparison: 12/06/2019

CLINICAL DATA: RIGHT pleural effusion post thoracentesis

EXAM:
CHEST  1 VIEW

[chest pa]
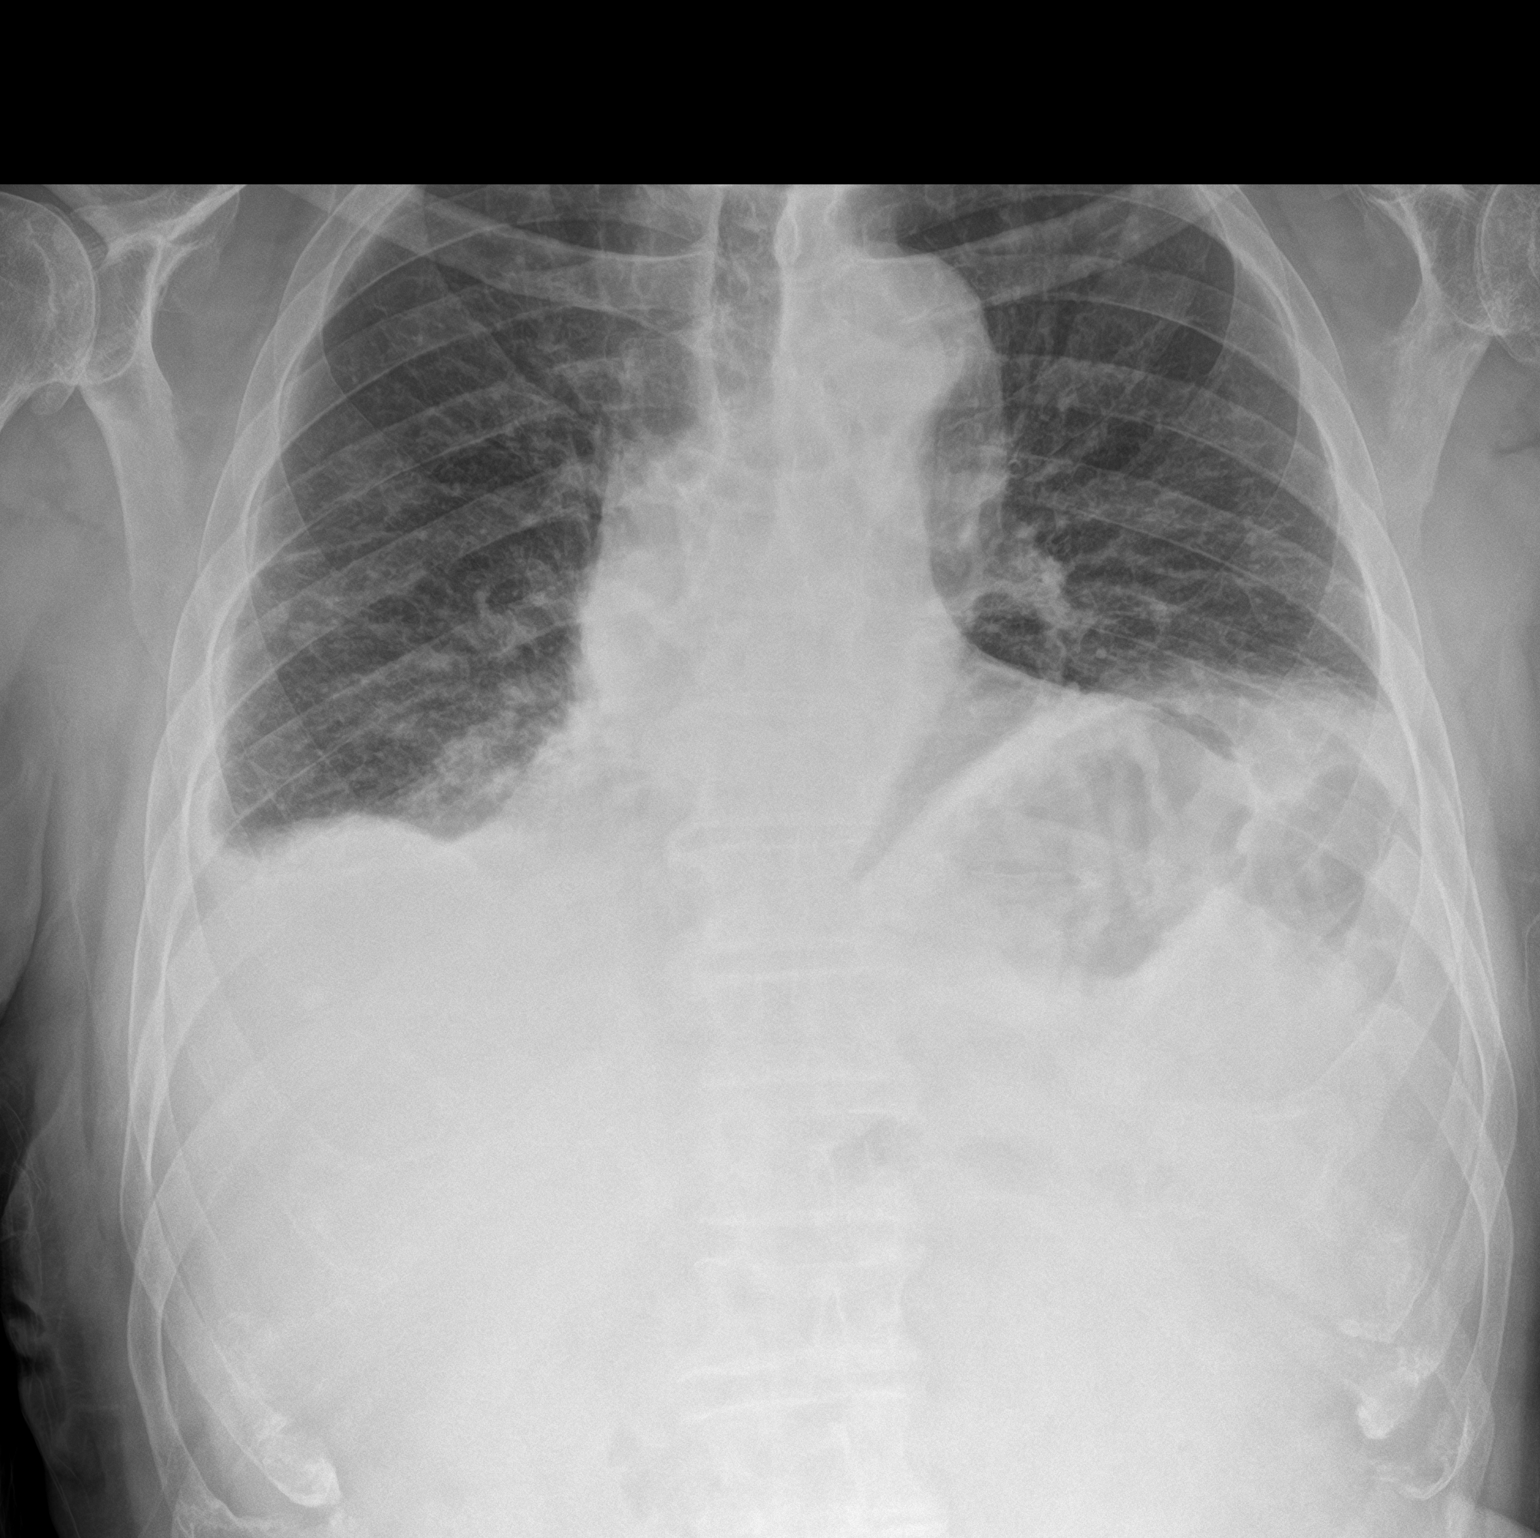

[1 of 1 positions shown; findings below may reference images not displayed]

FINDINGS: Normal heart size and pulmonary vascularity.

Tortuous thoracic aorta.

Chronic elevation of LEFT diaphragm.

Small RIGHT pleural effusion and mild bibasilar atelectasis.

No pneumothorax following RIGHT thoracentesis.
IMPRESSION: No pneumothorax following RIGHT thoracentesis.

## 2021-09-13 IMAGING — US US THORACENTESIS ASP PLEURAL SPACE W/IMG GUIDE
1 series · 4 of 4 positions shown · non-contrast
Comparison: none

INDICATION: RIGHT pleural effusion

[Series 1: us thoracentesis asp pleural space w/img guide · 0.28mm/px · 4 of 4 slices shown]
[im 1/4]
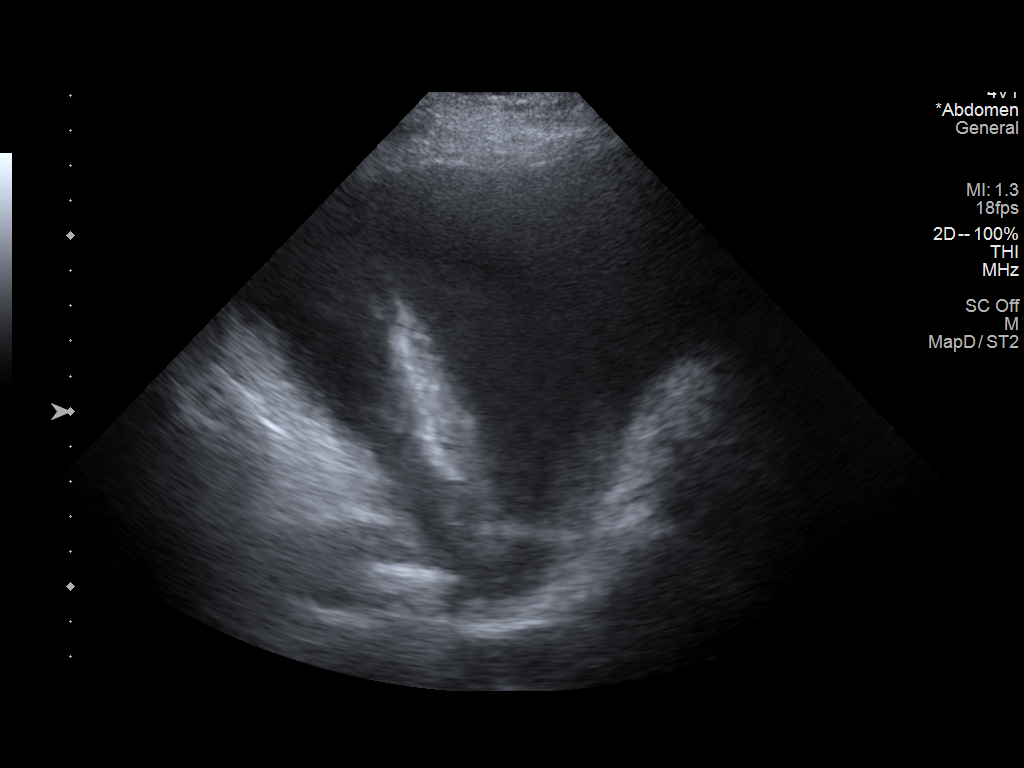
[im 2/4]
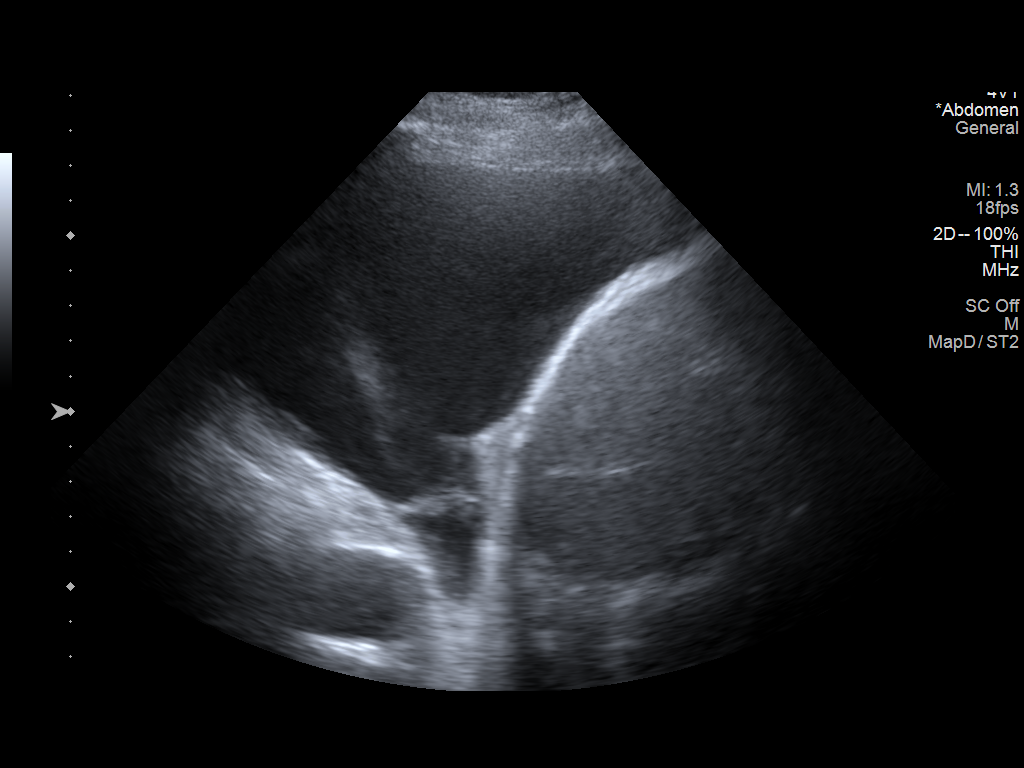
[im 3/4]
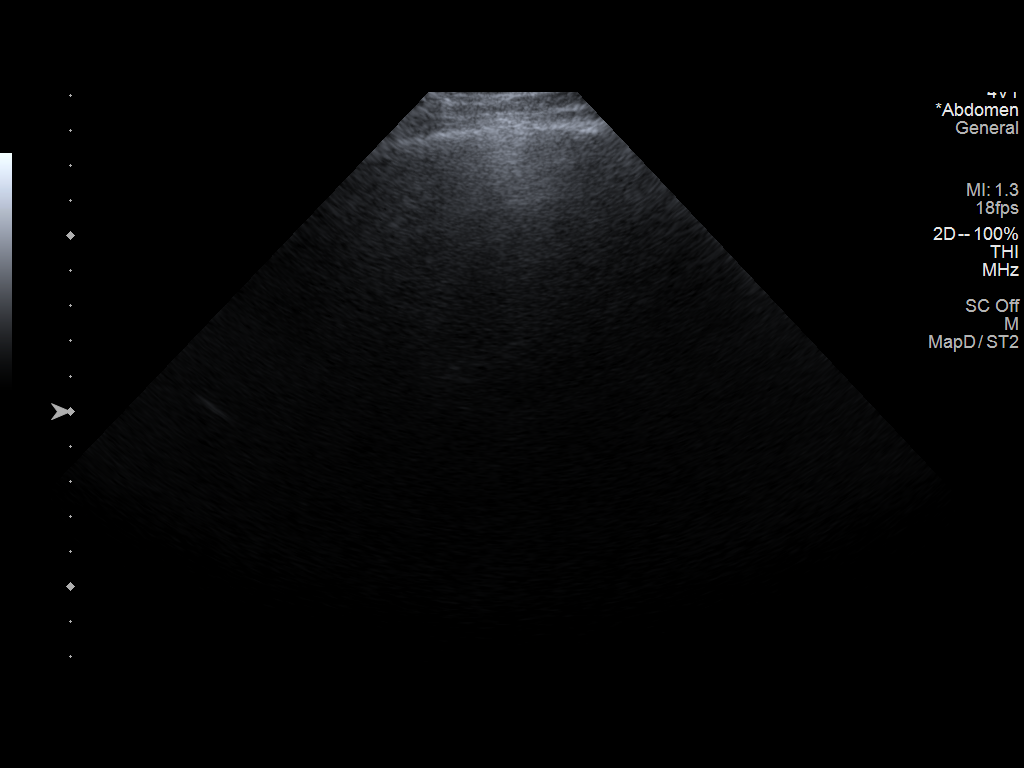
[im 4/4]
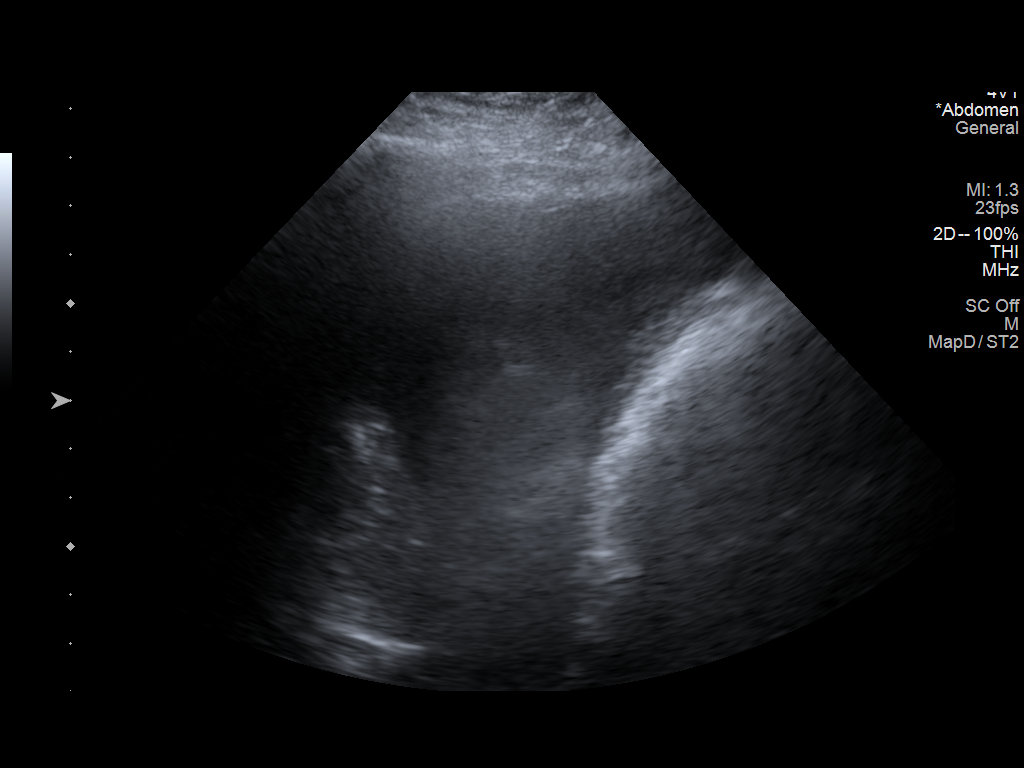

[4 of 4 positions shown; findings below may reference images not displayed]

EXAM:
ULTRASOUND GUIDED DIAGNOSTIC AND THERAPEUTIC RIGHT THORACENTESIS

MEDICATIONS:
None.

COMPLICATIONS:
None immediate.

PROCEDURE:
An ultrasound guided thoracentesis was thoroughly discussed with the
patient and questions answered. The benefits, risks, alternatives
and complications were also discussed. The patient understands and
wishes to proceed with the procedure. Written consent was obtained.

Ultrasound was performed to localize and mark an adequate pocket of
fluid in the RIGHT chest. The area was then prepped and draped in
the normal sterile fashion. 1% Lidocaine was used for local
anesthesia. Under ultrasound guidance a 8 French thoracentesis
catheter was introduced. Thoracentesis was performed. The catheter
was removed and a dressing applied.
FINDINGS: A total of approximately 450 mL of dark bloody fluid was removed.

Samples were sent to the laboratory as requested by the clinical
team.
IMPRESSION: Successful ultrasound guided RIGHT thoracentesis yielding 450 mL in
of pleural fluid.

## 2021-09-19 IMAGING — DX DG CHEST 1V PORT
1 series · 1 of 1 positions shown · non-contrast
Comparison: November 13, 2019

CLINICAL DATA: Shortness of breath with fatigue

EXAM:
PORTABLE CHEST 1 VIEW

[chest ap]
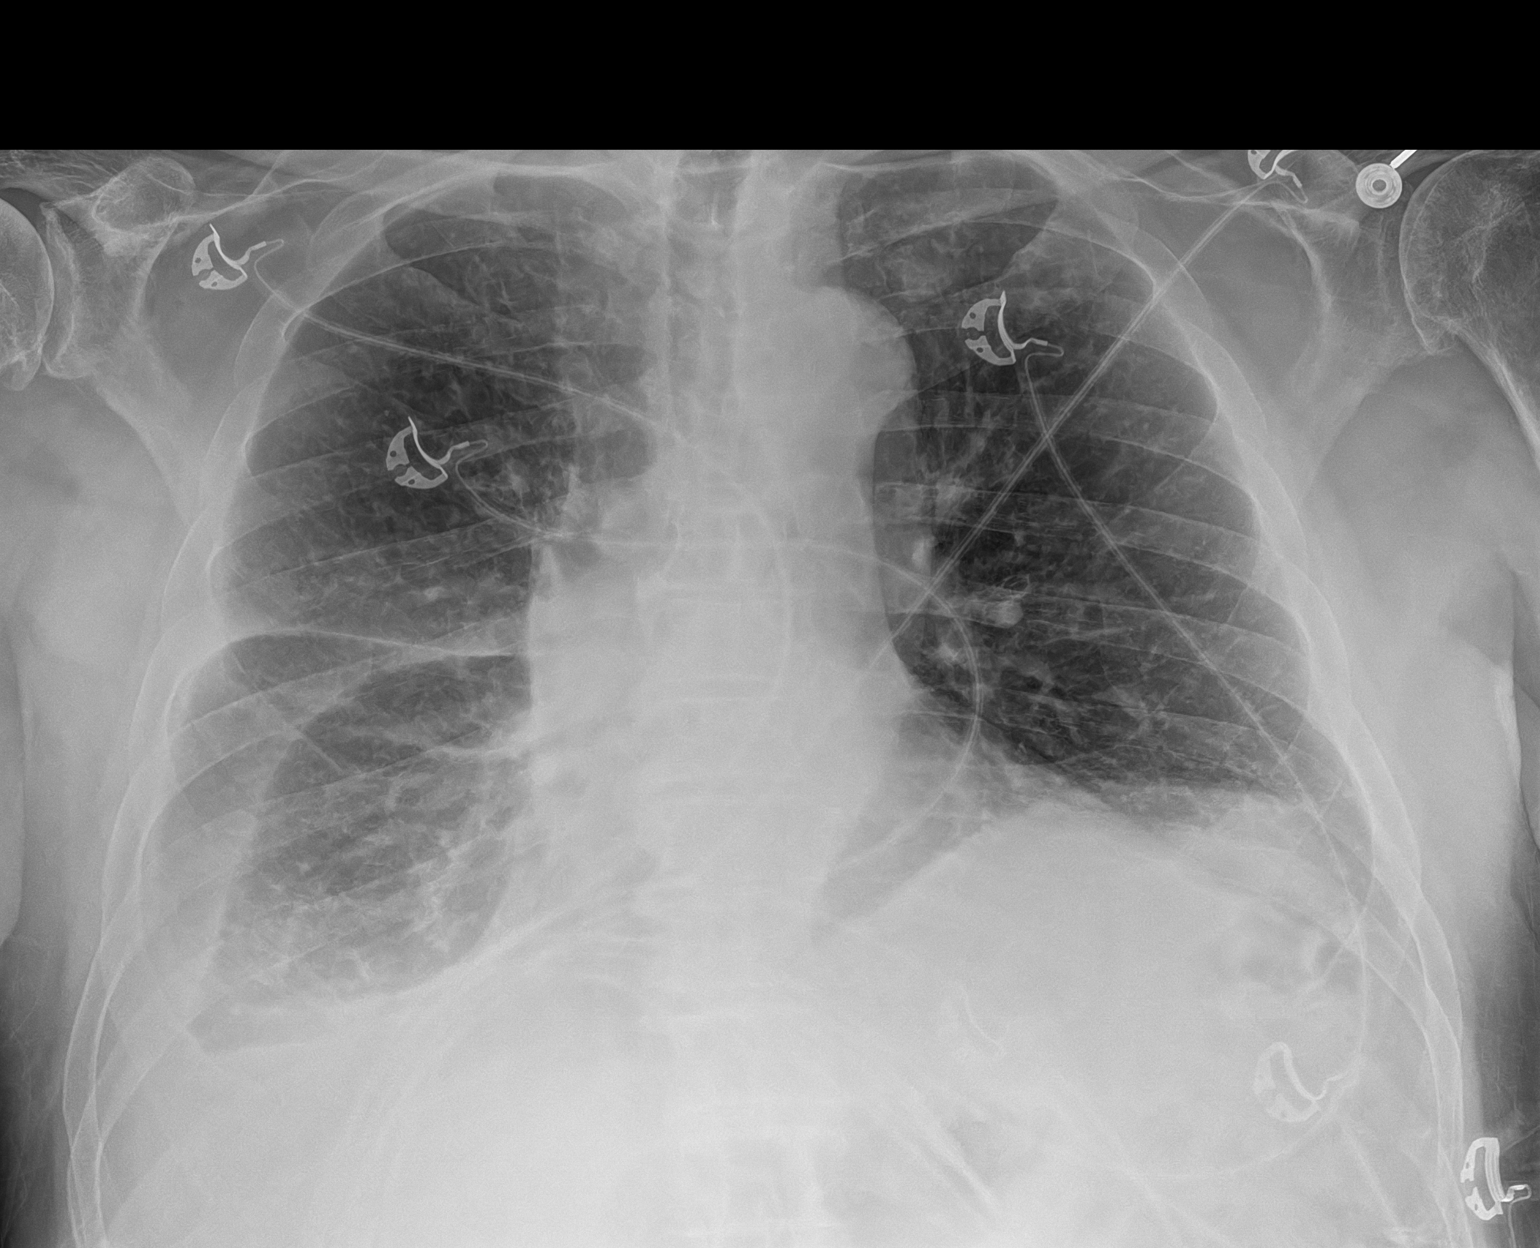

[1 of 1 positions shown; findings below may reference images not displayed]

FINDINGS: There is a fairly small loculated right pleural effusion. There is a
minimal left pleural effusion. There is atelectatic change in the
lung bases. There is no consolidation. Heart size and pulmonary
vascularity are normal. No adenopathy. There is postoperative change
in the lower cervical region.
IMPRESSION: Fairly small right sided loculated pleural effusion with minimal
left pleural effusion. Bibasilar atelectasis. Lungs otherwise clear.
Stable cardiac silhouette.
